# Patient Record
Sex: Male | Born: 1945 | Race: White | Hispanic: No | Smoking: Former smoker
Health system: Southern US, Community
[De-identification: ages and names within clinical notes are randomized; demographics above are authoritative.]

## PROBLEM LIST (undated history)

## (undated) DIAGNOSIS — Z87442 Personal history of urinary calculi: Secondary | ICD-10-CM

## (undated) DIAGNOSIS — R42 Dizziness and giddiness: Secondary | ICD-10-CM

## (undated) DIAGNOSIS — Z972 Presence of dental prosthetic device (complete) (partial): Secondary | ICD-10-CM

## (undated) DIAGNOSIS — E119 Type 2 diabetes mellitus without complications: Secondary | ICD-10-CM

## (undated) DIAGNOSIS — E7800 Pure hypercholesterolemia, unspecified: Secondary | ICD-10-CM

## (undated) DIAGNOSIS — M109 Gout, unspecified: Secondary | ICD-10-CM

## (undated) DIAGNOSIS — K5792 Diverticulitis of intestine, part unspecified, without perforation or abscess without bleeding: Secondary | ICD-10-CM

## (undated) DIAGNOSIS — K219 Gastro-esophageal reflux disease without esophagitis: Secondary | ICD-10-CM

## (undated) DIAGNOSIS — I10 Essential (primary) hypertension: Secondary | ICD-10-CM

## (undated) HISTORY — PX: TONSILLECTOMY: SUR1361

## (undated) HISTORY — PX: BLADDER STONE REMOVAL: SHX568

---

## 1999-06-10 HISTORY — PX: CORONARY ARTERY BYPASS GRAFT: SHX141

## 2010-12-25 DIAGNOSIS — I10 Essential (primary) hypertension: Secondary | ICD-10-CM | POA: Insufficient documentation

## 2010-12-25 DIAGNOSIS — N529 Male erectile dysfunction, unspecified: Secondary | ICD-10-CM | POA: Insufficient documentation

## 2011-08-04 DIAGNOSIS — G458 Other transient cerebral ischemic attacks and related syndromes: Secondary | ICD-10-CM | POA: Insufficient documentation

## 2011-08-04 DIAGNOSIS — I6529 Occlusion and stenosis of unspecified carotid artery: Secondary | ICD-10-CM | POA: Insufficient documentation

## 2014-10-02 DIAGNOSIS — M109 Gout, unspecified: Secondary | ICD-10-CM | POA: Insufficient documentation

## 2014-10-02 DIAGNOSIS — I25810 Atherosclerosis of coronary artery bypass graft(s) without angina pectoris: Secondary | ICD-10-CM | POA: Insufficient documentation

## 2014-10-02 DIAGNOSIS — E785 Hyperlipidemia, unspecified: Secondary | ICD-10-CM | POA: Insufficient documentation

## 2016-01-21 DIAGNOSIS — F5102 Adjustment insomnia: Secondary | ICD-10-CM | POA: Insufficient documentation

## 2016-01-21 DIAGNOSIS — D229 Melanocytic nevi, unspecified: Secondary | ICD-10-CM | POA: Insufficient documentation

## 2016-01-21 DIAGNOSIS — R058 Other specified cough: Secondary | ICD-10-CM | POA: Insufficient documentation

## 2016-01-30 DIAGNOSIS — L578 Other skin changes due to chronic exposure to nonionizing radiation: Secondary | ICD-10-CM | POA: Insufficient documentation

## 2016-06-30 DIAGNOSIS — I152 Hypertension secondary to endocrine disorders: Secondary | ICD-10-CM | POA: Insufficient documentation

## 2016-06-30 DIAGNOSIS — Z0000 Encounter for general adult medical examination without abnormal findings: Secondary | ICD-10-CM | POA: Insufficient documentation

## 2016-06-30 DIAGNOSIS — E1159 Type 2 diabetes mellitus with other circulatory complications: Secondary | ICD-10-CM | POA: Insufficient documentation

## 2016-06-30 DIAGNOSIS — E1169 Type 2 diabetes mellitus with other specified complication: Secondary | ICD-10-CM | POA: Insufficient documentation

## 2016-06-30 DIAGNOSIS — E785 Hyperlipidemia, unspecified: Secondary | ICD-10-CM | POA: Insufficient documentation

## 2016-07-03 DIAGNOSIS — Z7901 Long term (current) use of anticoagulants: Secondary | ICD-10-CM | POA: Insufficient documentation

## 2017-01-06 DIAGNOSIS — E118 Type 2 diabetes mellitus with unspecified complications: Secondary | ICD-10-CM | POA: Insufficient documentation

## 2017-12-02 ENCOUNTER — Inpatient Hospital Stay (HOSPITAL_COMMUNITY)
Admission: EM | Admit: 2017-12-02 | Discharge: 2017-12-05 | DRG: 392 | Disposition: A | Discharge status: Home / Self Care | Payer: 59 | Attending: Internal Medicine | Admitting: Internal Medicine

## 2017-12-02 ENCOUNTER — Ambulatory Visit (INDEPENDENT_AMBULATORY_CARE_PROVIDER_SITE_OTHER)
Admission: EM | Admit: 2017-12-02 | Discharge: 2017-12-02 | Disposition: A | Discharge status: Home / Self Care | Payer: 59 | Source: Home / Self Care

## 2017-12-02 ENCOUNTER — Encounter (HOSPITAL_COMMUNITY): Payer: Self-pay | Admitting: Emergency Medicine

## 2017-12-02 ENCOUNTER — Encounter (HOSPITAL_COMMUNITY): Payer: Self-pay

## 2017-12-02 ENCOUNTER — Emergency Department (HOSPITAL_COMMUNITY): Payer: 59

## 2017-12-02 ENCOUNTER — Other Ambulatory Visit: Payer: Self-pay

## 2017-12-02 DIAGNOSIS — E118 Type 2 diabetes mellitus with unspecified complications: Secondary | ICD-10-CM | POA: Diagnosis not present

## 2017-12-02 DIAGNOSIS — M109 Gout, unspecified: Secondary | ICD-10-CM | POA: Diagnosis present

## 2017-12-02 DIAGNOSIS — E1140 Type 2 diabetes mellitus with diabetic neuropathy, unspecified: Secondary | ICD-10-CM | POA: Diagnosis present

## 2017-12-02 DIAGNOSIS — I700 Atherosclerosis of aorta: Secondary | ICD-10-CM | POA: Diagnosis present

## 2017-12-02 DIAGNOSIS — R1032 Left lower quadrant pain: Secondary | ICD-10-CM

## 2017-12-02 DIAGNOSIS — K5792 Diverticulitis of intestine, part unspecified, without perforation or abscess without bleeding: Secondary | ICD-10-CM | POA: Diagnosis not present

## 2017-12-02 DIAGNOSIS — R011 Cardiac murmur, unspecified: Secondary | ICD-10-CM | POA: Diagnosis present

## 2017-12-02 DIAGNOSIS — K5720 Diverticulitis of large intestine with perforation and abscess without bleeding: Secondary | ICD-10-CM | POA: Diagnosis present

## 2017-12-02 DIAGNOSIS — I2510 Atherosclerotic heart disease of native coronary artery without angina pectoris: Secondary | ICD-10-CM | POA: Diagnosis present

## 2017-12-02 DIAGNOSIS — K219 Gastro-esophageal reflux disease without esophagitis: Secondary | ICD-10-CM | POA: Diagnosis present

## 2017-12-02 DIAGNOSIS — N183 Chronic kidney disease, stage 3 (moderate): Secondary | ICD-10-CM | POA: Diagnosis present

## 2017-12-02 DIAGNOSIS — E1122 Type 2 diabetes mellitus with diabetic chronic kidney disease: Secondary | ICD-10-CM | POA: Diagnosis present

## 2017-12-02 DIAGNOSIS — I129 Hypertensive chronic kidney disease with stage 1 through stage 4 chronic kidney disease, or unspecified chronic kidney disease: Secondary | ICD-10-CM | POA: Diagnosis present

## 2017-12-02 DIAGNOSIS — Z87442 Personal history of urinary calculi: Secondary | ICD-10-CM | POA: Diagnosis not present

## 2017-12-02 DIAGNOSIS — N179 Acute kidney failure, unspecified: Secondary | ICD-10-CM | POA: Diagnosis present

## 2017-12-02 DIAGNOSIS — E119 Type 2 diabetes mellitus without complications: Secondary | ICD-10-CM

## 2017-12-02 DIAGNOSIS — N529 Male erectile dysfunction, unspecified: Secondary | ICD-10-CM | POA: Diagnosis present

## 2017-12-02 DIAGNOSIS — Z951 Presence of aortocoronary bypass graft: Secondary | ICD-10-CM | POA: Diagnosis not present

## 2017-12-02 DIAGNOSIS — Z87891 Personal history of nicotine dependence: Secondary | ICD-10-CM

## 2017-12-02 DIAGNOSIS — N400 Enlarged prostate without lower urinary tract symptoms: Secondary | ICD-10-CM | POA: Diagnosis present

## 2017-12-02 DIAGNOSIS — Z955 Presence of coronary angioplasty implant and graft: Secondary | ICD-10-CM | POA: Diagnosis not present

## 2017-12-02 DIAGNOSIS — E785 Hyperlipidemia, unspecified: Secondary | ICD-10-CM | POA: Diagnosis present

## 2017-12-02 DIAGNOSIS — R109 Unspecified abdominal pain: Secondary | ICD-10-CM

## 2017-12-02 HISTORY — DX: Gout, unspecified: M10.9

## 2017-12-02 HISTORY — DX: Personal history of urinary calculi: Z87.442

## 2017-12-02 HISTORY — DX: Type 2 diabetes mellitus without complications: E11.9

## 2017-12-02 HISTORY — DX: Diverticulitis of intestine, part unspecified, without perforation or abscess without bleeding: K57.92

## 2017-12-02 HISTORY — DX: Pure hypercholesterolemia, unspecified: E78.00

## 2017-12-02 HISTORY — DX: Gastro-esophageal reflux disease without esophagitis: K21.9

## 2017-12-02 HISTORY — DX: Essential (primary) hypertension: I10

## 2017-12-02 LAB — CBC WITH DIFFERENTIAL/PLATELET
ABS IMMATURE GRANULOCYTES: 0 10*3/uL (ref 0.0–0.1)
BASOS PCT: 0 %
Basophils Absolute: 0 10*3/uL (ref 0.0–0.1)
Eosinophils Absolute: 0.1 10*3/uL (ref 0.0–0.7)
Eosinophils Relative: 1 %
HEMATOCRIT: 48.1 % (ref 39.0–52.0)
HEMOGLOBIN: 15.9 g/dL (ref 13.0–17.0)
Immature Granulocytes: 0 %
LYMPHS PCT: 17 %
Lymphs Abs: 1.8 10*3/uL (ref 0.7–4.0)
MCH: 30.1 pg (ref 26.0–34.0)
MCHC: 33.1 g/dL (ref 30.0–36.0)
MCV: 90.9 fL (ref 78.0–100.0)
MONO ABS: 1.2 10*3/uL — AB (ref 0.1–1.0)
MONOS PCT: 11 %
NEUTROS ABS: 7.3 10*3/uL (ref 1.7–7.7)
Neutrophils Relative %: 71 %
Platelets: 166 10*3/uL (ref 150–400)
RBC: 5.29 MIL/uL (ref 4.22–5.81)
RDW: 14.6 % (ref 11.5–15.5)
WBC: 10.5 10*3/uL (ref 4.0–10.5)

## 2017-12-02 LAB — POCT URINALYSIS DIP (DEVICE)
Bilirubin Urine: NEGATIVE
GLUCOSE, UA: 100 mg/dL — AB
Hgb urine dipstick: NEGATIVE
KETONES UR: NEGATIVE mg/dL
LEUKOCYTES UA: NEGATIVE
Nitrite: NEGATIVE
Protein, ur: NEGATIVE mg/dL
UROBILINOGEN UA: 1 mg/dL (ref 0.0–1.0)
pH: 5.5 (ref 5.0–8.0)

## 2017-12-02 LAB — COMPREHENSIVE METABOLIC PANEL
ALT: 22 U/L (ref 0–44)
AST: 24 U/L (ref 15–41)
Albumin: 3.8 g/dL (ref 3.5–5.0)
Alkaline Phosphatase: 87 U/L (ref 38–126)
Anion gap: 9 (ref 5–15)
BILIRUBIN TOTAL: 1.7 mg/dL — AB (ref 0.3–1.2)
BUN: 18 mg/dL (ref 8–23)
CO2: 27 mmol/L (ref 22–32)
Calcium: 8.9 mg/dL (ref 8.9–10.3)
Chloride: 102 mmol/L (ref 98–111)
Creatinine, Ser: 1.45 mg/dL — ABNORMAL HIGH (ref 0.61–1.24)
GFR calc Af Amer: 53 mL/min — ABNORMAL LOW (ref >60)
GFR calc non Af Amer: 46 mL/min — ABNORMAL LOW (ref >60)
Glucose, Bld: 105 mg/dL — ABNORMAL HIGH (ref 70–99)
POTASSIUM: 4.3 mmol/L (ref 3.5–5.1)
Sodium: 138 mmol/L (ref 135–145)
TOTAL PROTEIN: 7.6 g/dL (ref 6.5–8.1)

## 2017-12-02 LAB — LIPASE, BLOOD: Lipase: 31 U/L (ref 11–51)

## 2017-12-02 LAB — I-STAT CHEM 8, ED
BUN: 21 mg/dL (ref 8–23)
CALCIUM ION: 1.1 mmol/L — AB (ref 1.15–1.40)
CHLORIDE: 99 mmol/L (ref 98–111)
CREATININE: 1.3 mg/dL — AB (ref 0.61–1.24)
GLUCOSE: 99 mg/dL (ref 70–99)
HCT: 49 % (ref 39.0–52.0)
Hemoglobin: 16.7 g/dL (ref 13.0–17.0)
Potassium: 4.2 mmol/L (ref 3.5–5.1)
Sodium: 137 mmol/L (ref 135–145)
TCO2: 25 mmol/L (ref 22–32)

## 2017-12-02 LAB — GLUCOSE, CAPILLARY: GLUCOSE-CAPILLARY: 108 mg/dL — AB (ref 70–99)

## 2017-12-02 MED ORDER — ACETAMINOPHEN 325 MG PO TABS
650.0000 mg | ORAL_TABLET | Freq: Four times a day (QID) | ORAL | Status: DC | PRN
  Administered 2017-12-03: 650 mg via ORAL
  Filled 2017-12-02: qty 2

## 2017-12-02 MED ORDER — ALLOPURINOL 300 MG PO TABS
300.0000 mg | ORAL_TABLET | Freq: Every day | ORAL | Status: DC
  Administered 2017-12-03 – 2017-12-05 (×3): 300 mg via ORAL
  Filled 2017-12-02 (×3): qty 1

## 2017-12-02 MED ORDER — ATORVASTATIN CALCIUM 20 MG PO TABS
20.0000 mg | ORAL_TABLET | Freq: Every day | ORAL | Status: DC
  Administered 2017-12-03 – 2017-12-05 (×3): 20 mg via ORAL
  Filled 2017-12-02 (×3): qty 1

## 2017-12-02 MED ORDER — CLOPIDOGREL BISULFATE 75 MG PO TABS
75.0000 mg | ORAL_TABLET | Freq: Every day | ORAL | Status: DC
  Administered 2017-12-03 – 2017-12-05 (×3): 75 mg via ORAL
  Filled 2017-12-02 (×3): qty 1

## 2017-12-02 MED ORDER — ASPIRIN 325 MG PO TABS
325.0000 mg | ORAL_TABLET | Freq: Every day | ORAL | Status: DC
  Administered 2017-12-03 – 2017-12-05 (×3): 325 mg via ORAL
  Filled 2017-12-02 (×3): qty 1

## 2017-12-02 MED ORDER — GABAPENTIN 300 MG PO CAPS
300.0000 mg | ORAL_CAPSULE | Freq: Every day | ORAL | Status: DC
  Administered 2017-12-03 – 2017-12-05 (×3): 300 mg via ORAL
  Filled 2017-12-02 (×3): qty 1

## 2017-12-02 MED ORDER — CIPROFLOXACIN IN D5W 400 MG/200ML IV SOLN
400.0000 mg | Freq: Two times a day (BID) | INTRAVENOUS | Status: DC
  Administered 2017-12-03 – 2017-12-05 (×5): 400 mg via INTRAVENOUS
  Filled 2017-12-02 (×5): qty 200

## 2017-12-02 MED ORDER — TRAZODONE HCL 50 MG PO TABS: 50.0000 mg | ORAL_TABLET | Freq: Every evening | ORAL | Status: DC | PRN

## 2017-12-02 MED ORDER — ONDANSETRON HCL 4 MG/2ML IJ SOLN: 4.0000 mg | Freq: Four times a day (QID) | INTRAMUSCULAR | Status: DC | PRN

## 2017-12-02 MED ORDER — ENOXAPARIN SODIUM 40 MG/0.4ML ~~LOC~~ SOLN
40.0000 mg | SUBCUTANEOUS | Status: DC
  Administered 2017-12-02 – 2017-12-04 (×3): 40 mg via SUBCUTANEOUS
  Filled 2017-12-02 (×3): qty 0.4

## 2017-12-02 MED ORDER — IOHEXOL 300 MG/ML  SOLN
100.0000 mL | Freq: Once | INTRAMUSCULAR | Status: AC | PRN
  Administered 2017-12-02: 100 mL via INTRAVENOUS

## 2017-12-02 MED ORDER — INSULIN ASPART 100 UNIT/ML ~~LOC~~ SOLN
0.0000 [IU] | Freq: Three times a day (TID) | SUBCUTANEOUS | Status: DC
  Administered 2017-12-04: 1 [IU] via SUBCUTANEOUS

## 2017-12-02 MED ORDER — HYDROCODONE-ACETAMINOPHEN 5-325 MG PO TABS
1.0000 | ORAL_TABLET | Freq: Once | ORAL | Status: AC
  Administered 2017-12-02: 1 via ORAL
  Filled 2017-12-02: qty 1

## 2017-12-02 MED ORDER — METRONIDAZOLE IN NACL 5-0.79 MG/ML-% IV SOLN
500.0000 mg | Freq: Three times a day (TID) | INTRAVENOUS | Status: DC
  Administered 2017-12-03 – 2017-12-05 (×7): 500 mg via INTRAVENOUS
  Filled 2017-12-02 (×8): qty 100

## 2017-12-02 MED ORDER — METOPROLOL SUCCINATE ER 50 MG PO TB24
50.0000 mg | ORAL_TABLET | Freq: Every day | ORAL | Status: DC
  Administered 2017-12-03 – 2017-12-05 (×3): 50 mg via ORAL
  Filled 2017-12-02 (×3): qty 1

## 2017-12-02 MED ORDER — ONDANSETRON HCL 4 MG PO TABS: 4.0000 mg | ORAL_TABLET | Freq: Four times a day (QID) | ORAL | Status: DC | PRN

## 2017-12-02 MED ORDER — PANTOPRAZOLE SODIUM 20 MG PO TBEC
20.0000 mg | DELAYED_RELEASE_TABLET | Freq: Every day | ORAL | Status: DC
  Administered 2017-12-03 – 2017-12-05 (×3): 20 mg via ORAL
  Filled 2017-12-02 (×3): qty 1

## 2017-12-02 MED ORDER — ONDANSETRON 4 MG PO TBDP
4.0000 mg | ORAL_TABLET | Freq: Once | ORAL | Status: AC
  Administered 2017-12-02: 4 mg via ORAL
  Filled 2017-12-02: qty 1

## 2017-12-02 MED ORDER — CIPROFLOXACIN IN D5W 400 MG/200ML IV SOLN
400.0000 mg | Freq: Once | INTRAVENOUS | Status: AC
  Administered 2017-12-02: 400 mg via INTRAVENOUS
  Filled 2017-12-02: qty 200

## 2017-12-02 MED ORDER — HYDROCODONE-ACETAMINOPHEN 5-325 MG PO TABS
1.0000 | ORAL_TABLET | ORAL | Status: DC | PRN
  Administered 2017-12-02: 1 via ORAL
  Filled 2017-12-02: qty 1

## 2017-12-02 MED ORDER — METRONIDAZOLE IN NACL 5-0.79 MG/ML-% IV SOLN
500.0000 mg | Freq: Once | INTRAVENOUS | Status: AC
  Administered 2017-12-02: 500 mg via INTRAVENOUS
  Filled 2017-12-02: qty 100

## 2017-12-02 MED ORDER — ACETAMINOPHEN 650 MG RE SUPP: 650.0000 mg | Freq: Four times a day (QID) | RECTAL | Status: DC | PRN

## 2017-12-02 MED ORDER — SODIUM CHLORIDE 0.9 % IV SOLN
INTRAVENOUS | Status: DC
  Administered 2017-12-02 – 2017-12-03 (×3): via INTRAVENOUS

## 2017-12-02 NOTE — ED Triage Notes (Signed)
Patient to ED from [facility] for further evaluation of LLQ pain x 2 days, denies N/V/D or fevers/chills. Some positions make pain worse.

## 2017-12-02 NOTE — H&P (Signed)
History and Physical    Brendan Callahan KGY:[id number] DOB: 1946-01-10 DOA: 12/02/2017  PCP: Vira Blanco, MD   Patient coming from: Home   Chief Complaint: Abdominal Pain  HPI: Brendan Callahan is a 72 y.o. male with medical history significant for CAD s/p CABG 2001, HTN, DM, Gout, who presented to the ED with complaints of left-sided lower abdominal pain of 2 days duration.  Patient denies fevers but endorses chills 3 days ago. Patient denies nausea vomiting or loose stools, but reports hardly any p.o. intake over the past 2 days.  No dysuria.  ED Course: Heart rate 56-62, temperature 02.6, systolic blood pressure 378- 149.  WBC 10.5.  Creatinine elevated 1.45.  Lipase 31.  UA clean.  CT abdomen and pelvis W contrast-acute sigmoid diverticulitis.  Few small locules of gas within this region suspicious for possible early microperforation.  No evidence of abscess.  Extensive aortic atherosclerosis.  Enlarged prostate.  Patient was started on IV metronidazole and IV ciprofloxacin.  Hospitalist was called to admit for sigmoid diverticulitis.  Review of Systems: As per HPI otherwise 10 point review of systems negative.   Past Medical History:  Diagnosis Date  . Diabetes mellitus without complication ([place])   . GERD (gastroesophageal reflux disease)   . Hypertension     History reviewed. No pertinent surgical history.   reports that he has never smoked. He has never used smokeless tobacco. He reports that he drinks alcohol. He reports that he does not use drugs.  No Known Allergies  Family History  Problem Relation Age of Onset  . Hernia Mother   . Healthy Father    Prior to Admission medications   Not on File    Physical Exam: Vitals:   12/02/17 1745 12/02/17 1830 12/02/17 1900 12/02/17 1930  BP: 128/68 (!) 149/80 (!) 146/83 137/86  Pulse: 62 (!) 58 (!) 55 (!) 55  Resp: 14 19 15 18   Temp:      TempSrc:      SpO2: 91% 94% 93% 95%    Constitutional: NAD, calm,  comfortable Vitals:   12/02/17 1745 12/02/17 1830 12/02/17 1900 12/02/17 1930  BP: 128/68 (!) 149/80 (!) 146/83 137/86  Pulse: 62 (!) 58 (!) 55 (!) 55  Resp: 14 19 15 18   Temp:      TempSrc:      SpO2: 91% 94% 93% 95%   Eyes: PERRL, lids and conjunctivae normal ENMT: Mucous membranes are moist. Posterior pharynx clear of any exudate or lesions. Neck: normal, supple, no masses, no thyromegaly Respiratory: clear to auscultation bilaterally, no wheezing, no crackles. Normal respiratory effort. No accessory muscle use.  Cardiovascular: Regular rate and rhythm, 3/6 systolic mumur- patient reports he is not aware, no rubs / gallops. No extremity edema. 2+ pedal pulses.  Abdomen: Mild LLQ tenderness, no masses palpated. No hepatosplenomegaly. Bowel sounds reduced.  musculoskeletal: no clubbing / cyanosis. No joint deformity upper and lower extremities. Good ROM, no contractures. Normal muscle tone.  Skin: no rashes, lesions, ulcers. No induration Neurologic: CN 2-12 grossly intact. Strength 5/5 in all 4.  Psychiatric: Normal judgment and insight. Alert and oriented x 3. Normal mood.   Labs on Admission: I have personally reviewed following labs and imaging studies  CBC: Recent Labs  Lab 12/02/17 1617 12/02/17 1624  WBC 10.5  --   NEUTROABS 7.3  --   HGB 15.9 16.7  HCT 48.1 49.0  MCV 90.9  --   PLT 166  --    Basic  Metabolic Panel: Recent Labs  Lab 12/02/17 1617 12/02/17 1624  NA 138 137  K 4.3 4.2  CL 102 99  CO2 27  --   GLUCOSE 105* 99  BUN 18 21  CREATININE 1.45* 1.30*  CALCIUM 8.9  --    GFR: CrCl cannot be calculated (Unknown ideal weight.). Liver Function Tests: Recent Labs  Lab 12/02/17 1617  AST 24  ALT 22  ALKPHOS 87  BILITOT 1.7*  PROT 7.6  ALBUMIN 3.8   Recent Labs  Lab 12/02/17 1617  LIPASE 31   Urine analysis:    Component Value Date/Time   LABSPEC >=1.030 12/02/2017 1503   PHURINE 5.5 12/02/2017 1503   GLUCOSEU 100 (A) 12/02/2017 1503    HGBUR NEGATIVE 12/02/2017 1503   BILIRUBINUR NEGATIVE 12/02/2017 1503   KETONESUR NEGATIVE 12/02/2017 1503   PROTEINUR NEGATIVE 12/02/2017 1503   UROBILINOGEN 1.0 12/02/2017 1503   NITRITE NEGATIVE 12/02/2017 1503   LEUKOCYTESUR NEGATIVE 12/02/2017 1503    Radiological Exams on Admission: Ct Abdomen Pelvis W Contrast  Result Date: 12/02/2017 CLINICAL DATA:  Initial evaluation for acute abdominal pain, suspect diverticulitis. EXAM: CT ABDOMEN AND PELVIS WITH CONTRAST TECHNIQUE: Multidetector CT imaging of the abdomen and pelvis was performed using the standard protocol following bolus administration of intravenous contrast. CONTRAST:  171mL OMNIPAQUE IOHEXOL 300 MG/ML  SOLN COMPARISON:  None available. FINDINGS: Lower chest: Visualized lung bases are clear. Hepatobiliary: Liver demonstrates a normal contrast enhanced appearance. Gallbladder within normal limits. No biliary dilatation. Pancreas: Scattered calcifications within the pancreatic body, likely related to previous/chronic pancreatitis. Pancreas otherwise unremarkable without acute inflammatory changes. Spleen: Spleen within normal limits. Adrenals/Urinary Tract: Adrenal glands are normal. Kidneys equal in size with symmetric enhancement. No nephrolithiasis, hydronephrosis, or focal enhancing renal mass. Few subcentimeter hypodensities within the right kidney too small the characterize. No hydroureter. Partially distended bladder within normal limits. Stomach/Bowel: Stomach within normal limits. No evidence for bowel obstruction. Normal appendix. Prominent inflammatory changes seen about multiple colonic diverticula within the left lower quadrant, consistent with acute diverticulitis. No evidence for abscess. Few tiny locules of air better not definitely position within the colonic lumen suspicious for possible micro perforation (series 7, image 152). No other acute inflammatory changes seen about the bowels. Vascular/Lymphatic: Extensive aorto  bi-iliac atherosclerotic disease. Mesenteric vessels patent proximally. No adenopathy. Reproductive: Prostate enlarged measuring up to 5.9 cm in transverse diameter. Other: No free air or fluid. Small fat containing paraumbilical and bilateral inguinal hernias noted. Musculoskeletal: Chronic fatty atrophy noted within the posterior paraspinous musculature. No acute osseous abnormality. No worrisome lytic or blastic osseous lesions. Multilevel degenerative spondylolysis, most notable at L4-5 and L5-S1. IMPRESSION: 1. Findings consistent with acute sigmoid diverticulitis. Few small locules of gas within this region suspicious for possible early micro perforation. No evidence for abscess. 2. Extensive aortic atherosclerosis. 3. Enlarged prostate. Critical Value/emergent results were called by telephone at the time of interpretation on 12/02/2017 at 5:47 pm to [facility] , who verbally acknowledged these results. Electronically Signed   By: Jeannine Boga M.D.   On: 12/02/2017 17:48    ENI:DPOE  Assessment/Plan Principal Problem:   Acute diverticulitis Active Problems:   CAD (coronary artery disease)   DM (diabetes mellitus) (HCC)   Gout   GERD (gastroesophageal reflux disease)  Acute Sigmoid diverticulitis- Abdominal Pain. WBC- 10.5.  Patient rules-out for sepsis. Abdominal CT W contrast-acute sigmoid diverticulitis, possible early microperforation.  No evidence for abscess. -Continue IV metronidazole ciprofloxacin started in ED -Bowel rest with clear  liquid diet -Hydrocodone acetaminophen as needed -New home Protonix  AKI- Cr- 1.45, per care everywhere last creatinine 06/2016- 0.9.  Contrast exposure today for CT abd. - N/s 100cc/hr x 1 day -BMP a.m.  CAD s/p CABG- 2001-appears stable. 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 Patient reports he is  not aware.  Does not appear symptomatic from it.  -Can follow-up outpatient   DVT prophylaxis: Lovenox Code Status: full Family Communication: None at bedside Disposition Plan: Per rounding team Consults called: None Admission status: Inpatient, MedSurg   Bethena Roys MD [facility] Pager [phone] From 6PM-2AM.  Otherwise please contact night-coverage [email or web address] Password [facility]  12/02/2017, 8:12 PM

## 2017-12-02 NOTE — ED Provider Notes (Signed)
Emergency Department Provider Note   I have reviewed the triage vital signs and the nursing notes.   HISTORY  Chief Complaint Abdominal Pain   HPI Brendan Callahan is a 72 y.o. male history of diabetes, hypertension who presents to the emergency department today with left lower quadrant abdominal pain.  Patient states that has been going on for couple days and significantly worse.  No nausea, vomiting, constipation, fevers or chills.  States never had as before.  No trauma to the area.  No pulled muscles or anything of that sort.  No urinary symptoms.  No back pain.  History of kidney stones but not like this. No other associated or modifying symptoms.    Past Medical History:  Diagnosis Date  . Acute diverticulitis 12/02/2017  . GERD (gastroesophageal reflux disease)   . Gout    "on daily RX" (12/02/2017)  . High cholesterol   . History of kidney stones   . Hypertension   . Type II diabetes mellitus [facility])     Patient Active Problem List   Diagnosis Date Noted  . Acute diverticulitis 12/02/2017  . CAD (coronary artery disease) 12/02/2017  . DM (diabetes mellitus) ([place]) 12/02/2017  . Gout 12/02/2017  . GERD (gastroesophageal reflux disease) 12/02/2017    Past Surgical History:  Procedure Laterality Date  . BLADDER STONE REMOVAL  ~ 2004  . CORONARY ARTERY BYPASS GRAFT  2001   CABG X3"  . TONSILLECTOMY        Allergies Patient has no known allergies.  Family History  Problem Relation Age of Onset  . Hernia Mother   . Healthy Father     Social History Social History   Tobacco Use  . Smoking status: Former Smoker    Packs/day: 1.00    Years: 20.00    Pack years: 20.00    Types: Cigarettes  . Smokeless tobacco: Never Used  . Tobacco comment: 12/02/2017 "nothing since 1980s"  Substance Use Topics  . Alcohol use: Yes    Frequency: Never    Comment: 12/02/2017 "couple beers/month"  . Drug use: Never    Review of Systems  All other systems negative  except as documented in the HPI. All pertinent positives and negatives as reviewed in the HPI. ____________________________________________   PHYSICAL EXAM:  VITAL SIGNS: ED Triage Vitals  Enc Vitals Group     BP 12/02/17 1533 131/76     Pulse Rate 12/02/17 1533 62     Resp 12/02/17 1533 18     Temp 12/02/17 1533 97.9 F (36.6 C)     Temp Source 12/02/17 1533 Oral     SpO2 12/02/17 1533 96 %    Constitutional: Alert and oriented. Well appearing and in no acute distress. Eyes: Conjunctivae are normal. PERRL. EOMI. Head: Atraumatic. Nose: No congestion/rhinnorhea. Mouth/Throat: Mucous membranes are moist.  Oropharynx non-erythematous. Neck: No stridor.  No meningeal signs.   Cardiovascular: Normal rate, regular rhythm. Good peripheral circulation. Grossly normal heart sounds.   Respiratory: Normal respiratory effort.  No retractions. Lungs CTAB. Gastrointestinal: Soft and ttp with localized guarding in LLQ. No distention.  Musculoskeletal: No lower extremity tenderness nor edema. No gross deformities of extremities. Neurologic:  Normal speech and language. No gross focal neurologic deficits are appreciated.  Skin:  Skin is warm, dry and intact. No rash noted.   ____________________________________________   LABS (all labs ordered are listed, but only abnormal results are displayed)  Labs Reviewed  COMPREHENSIVE METABOLIC PANEL - Abnormal; Notable for the following  components:      Result Value   Glucose, Bld 105 (*)    Creatinine, Ser 1.45 (*)    Total Bilirubin 1.7 (*)    GFR calc non Af Amer 46 (*)    GFR calc Af Amer 53 (*)    All other components within normal limits  CBC WITH DIFFERENTIAL/PLATELET - Abnormal; Notable for the following components:   Monocytes Absolute 1.2 (*)    All other components within normal limits  GLUCOSE, CAPILLARY - Abnormal; Notable for the following components:   Glucose-Capillary 108 (*)    All other components within normal limits    I-STAT CHEM 8, ED - Abnormal; Notable for the following components:   Creatinine, Ser 1.30 (*)    Calcium, Ion 1.10 (*)    All other components within normal limits  LIPASE, BLOOD  BASIC METABOLIC PANEL   ____________________________________________   RADIOLOGY  Ct Abdomen Pelvis W Contrast  Result Date: 12/02/2017 CLINICAL DATA:  Initial evaluation for acute abdominal pain, suspect diverticulitis. EXAM: CT ABDOMEN AND PELVIS WITH CONTRAST TECHNIQUE: Multidetector CT imaging of the abdomen and pelvis was performed using the standard protocol following bolus administration of intravenous contrast. CONTRAST:  188mL OMNIPAQUE IOHEXOL 300 MG/ML  SOLN COMPARISON:  None available. FINDINGS: Lower chest: Visualized lung bases are clear. Hepatobiliary: Liver demonstrates a normal contrast enhanced appearance. Gallbladder within normal limits. No biliary dilatation. Pancreas: Scattered calcifications within the pancreatic body, likely related to previous/chronic pancreatitis. Pancreas otherwise unremarkable without acute inflammatory changes. Spleen: Spleen within normal limits. Adrenals/Urinary Tract: Adrenal glands are normal. Kidneys equal in size with symmetric enhancement. No nephrolithiasis, hydronephrosis, or focal enhancing renal mass. Few subcentimeter hypodensities within the right kidney too small the characterize. No hydroureter. Partially distended bladder within normal limits. Stomach/Bowel: Stomach within normal limits. No evidence for bowel obstruction. Normal appendix. Prominent inflammatory changes seen about multiple colonic diverticula within the left lower quadrant, consistent with acute diverticulitis. No evidence for abscess. Few tiny locules of air better not definitely position within the colonic lumen suspicious for possible micro perforation (series 7, image 152). No other acute inflammatory changes seen about the bowels. Vascular/Lymphatic: Extensive aorto bi-iliac atherosclerotic  disease. Mesenteric vessels patent proximally. No adenopathy. Reproductive: Prostate enlarged measuring up to 5.9 cm in transverse diameter. Other: No free air or fluid. Small fat containing paraumbilical and bilateral inguinal hernias noted. Musculoskeletal: Chronic fatty atrophy noted within the posterior paraspinous musculature. No acute osseous abnormality. No worrisome lytic or blastic osseous lesions. Multilevel degenerative spondylolysis, most notable at L4-5 and L5-S1. IMPRESSION: 1. Findings consistent with acute sigmoid diverticulitis. Few small locules of gas within this region suspicious for possible early micro perforation. No evidence for abscess. 2. Extensive aortic atherosclerosis. 3. Enlarged prostate. Critical Value/emergent results were called by telephone at the time of interpretation on 12/02/2017 at 5:47 pm to [facility] , who verbally acknowledged these results. Electronically Signed   By: Jeannine Boga M.D.   On: 12/02/2017 17:48    ____________________________________________   PROCEDURES  Procedure(s) performed:   Procedures   ____________________________________________   INITIAL IMPRESSION / ASSESSMENT AND PLAN / ED COURSE  CT scan with diverticulitis and microperforation without abscess.  No frank peritonitis.  Antibiotic started.  Will admit to the hospital.     Pertinent labs & imaging results that were available during my care of the patient were reviewed by me and considered in my medical decision making (see chart for details).  ____________________________________________  FINAL CLINICAL IMPRESSION(S) / ED  DIAGNOSES  Final diagnoses:  Diverticulitis of large intestine with perforation, unspecified bleeding status     MEDICATIONS GIVEN DURING THIS VISIT:  Medications  enoxaparin (LOVENOX) injection 40 mg (40 mg Subcutaneous Given 12/02/17 2037)  acetaminophen (TYLENOL) tablet 650 mg (has no administration in time range)    Or    acetaminophen (TYLENOL) suppository 650 mg (has no administration in time range)  HYDROcodone-acetaminophen (NORCO/VICODIN) 5-325 MG per tablet 1 tablet (1 tablet Oral Given 12/02/17 2245)  ondansetron (ZOFRAN) tablet 4 mg (has no administration in time range)    Or  ondansetron (ZOFRAN) injection 4 mg (has no administration in time range)  traZODone (DESYREL) tablet 50 mg (has no administration in time range)  0.9 %  sodium chloride infusion ( Intravenous New Bag/Given 12/02/17 2037)  ciprofloxacin (CIPRO) IVPB 400 mg (has no administration in time range)  metroNIDAZOLE (FLAGYL) IVPB 500 mg (has no administration in time range)  allopurinol (ZYLOPRIM) tablet 300 mg (has no administration in time range)  aspirin tablet 325 mg (has no administration in time range)  atorvastatin (LIPITOR) tablet 20 mg (has no administration in time range)  clopidogrel (PLAVIX) tablet 75 mg (has no administration in time range)  gabapentin (NEURONTIN) capsule 300 mg (has no administration in time range)  metoprolol succinate (TOPROL-XL) 24 hr tablet 50 mg (has no administration in time range)  pantoprazole (PROTONIX) EC tablet 20 mg (has no administration in time range)  insulin aspart (novoLOG) injection 0-9 Units (has no administration in time range)  HYDROcodone-acetaminophen (NORCO/VICODIN) 5-325 MG per tablet 1 tablet (1 tablet Oral Given 12/02/17 1609)  ondansetron (ZOFRAN-ODT) disintegrating tablet 4 mg (4 mg Oral Given 12/02/17 1609)  iohexol (OMNIPAQUE) 300 MG/ML solution 100 mL (100 mLs Intravenous Contrast Given 12/02/17 1654)  ciprofloxacin (CIPRO) IVPB 400 mg (400 mg Intravenous New Bag/Given 12/02/17 1854)  metroNIDAZOLE (FLAGYL) IVPB 500 mg (500 mg Intravenous New Bag/Given 12/02/17 1852)     NEW OUTPATIENT MEDICATIONS STARTED DURING THIS VISIT:  Current Discharge Medication List      Note:  This note was prepared with assistance of Dragon voice recognition software. Occasional wrong-word or  sound-a-like substitutions may have occurred due to the inherent limitations of voice recognition software.   Lunabella Badgett, Corene Cornea, MD 12/03/17 [phone]

## 2017-12-02 NOTE — ED Triage Notes (Signed)
Pt presents with left sided flank pain x 2 days.

## 2017-12-02 NOTE — Discharge Instructions (Signed)
Please report to the ER immediately as you need to be evaluated for diverticulitis given your severe left lower abdominal pain.

## 2017-12-02 NOTE — ED Provider Notes (Signed)
  MRN: [id number] DOB: 16-Apr-1946  Subjective:   Brendan Callahan is a 72 y.o. male presenting for 2 day history of acute left groin pain. Had associated chills, decreased appetite. Has a history of renal stones, last episode was ~10 years ago. Has also been urinating frequently. Denies fever, n/v, bloody stools, diarrhea, constipation, dysuria, hematuria. Denies ever having a colonoscopy, history of diverticulitis, history of hernia.  Patient is currently taking metformin, sildenafil, gabapentin, Plavix, metoprolol, Lipitor, allopurinol.     No Known Allergies  Past Medical History:  Diagnosis Date  . Diabetes mellitus without complication ([place])   . GERD (gastroesophageal reflux disease)   . Hypertension     Has a history of stent placement, triple bypass heart surgery.   Objective:   Vitals: BP (!) 141/71   Pulse 63   Temp 98.4 F (36.9 C)   Resp 18   SpO2 100%   Physical Exam  Constitutional: He is oriented to person, place, and time. He appears well-developed and well-nourished.  HENT:  Mouth/Throat: Oropharynx is clear and moist.  Eyes: No scleral icterus.  Cardiovascular: Normal rate, regular rhythm and intact distal pulses. Exam reveals no gallop and no friction rub.  No murmur heard. Pulmonary/Chest: No respiratory distress. He has no wheezes. He has no rales.  Abdominal: Soft. Bowel sounds are normal. He exhibits no distension and no mass. There is tenderness (exiquisite over LLQ). There is guarding. There is no rebound.  Neurological: He is alert and oriented to person, place, and time.  Skin: Skin is warm and dry.  Psychiatric: He has a normal mood and affect.   Results for orders placed or performed during the hospital encounter of 12/02/17 (from the past 24 hour(s))  POCT urinalysis dip (device)     Status: Abnormal   Collection Time: 12/02/17  3:03 PM  Result Value Ref Range   Glucose, UA 100 (A) NEGATIVE mg/dL   Bilirubin Urine NEGATIVE NEGATIVE   Ketones, ur  NEGATIVE NEGATIVE mg/dL   Specific Gravity, Urine >=1.030 1.005 - 1.030   Hgb urine dipstick NEGATIVE NEGATIVE   pH 5.5 5.0 - 8.0   Protein, ur NEGATIVE NEGATIVE mg/dL   Urobilinogen, UA 1.0 0.0 - 1.0 mg/dL   Nitrite NEGATIVE NEGATIVE   Leukocytes, UA NEGATIVE NEGATIVE   Assessment and Plan :   Abdominal pain, left lower quadrant  Acute abdominal pain  Patient symptoms and physical exam findings concerning for acute diverticulitis.  Patient was directed to the ER for consideration of his CT abdomen and pelvis to rule out acute abdomen versus diverticulitis.  Patient verbalized understanding and agreed to report to the ER immediately.   Jaynee Eagles, PA-C 12/02/17 1511

## 2017-12-02 NOTE — ED Provider Notes (Signed)
Patient placed in Quick Look pathway, seen and evaluated   Chief Complaint: LLQ abd  HPI:    Patient sent from UC,, L:LQ AP x 2 days. Currently 3/10, worse with movement and breathing + chills, - n/v/d, - PSHx and- fevers ROS: LLQ abd pain (one)  Physical Exam:   Gen: No distress  Neuro: Awake and Alert  Skin: Warm    Focused Exam: TTP LLQ abd   Initiation of care has begun. The patient has been counseled on the process, plan, and necessity for staying for the completion/evaluation, and the remainder of the medical screening examination    Margarita Mail, Hershal Coria 12/02/17 1602    Mesner, Corene Cornea, MD 12/02/17 [phone]

## 2017-12-03 DIAGNOSIS — K5792 Diverticulitis of intestine, part unspecified, without perforation or abscess without bleeding: Secondary | ICD-10-CM

## 2017-12-03 DIAGNOSIS — K5720 Diverticulitis of large intestine with perforation and abscess without bleeding: Principal | ICD-10-CM

## 2017-12-03 DIAGNOSIS — K219 Gastro-esophageal reflux disease without esophagitis: Secondary | ICD-10-CM

## 2017-12-03 DIAGNOSIS — N179 Acute kidney failure, unspecified: Secondary | ICD-10-CM

## 2017-12-03 LAB — GLUCOSE, CAPILLARY
GLUCOSE-CAPILLARY: 95 mg/dL (ref 70–99)
GLUCOSE-CAPILLARY: 79 mg/dL (ref 70–99)
GLUCOSE-CAPILLARY: 97 mg/dL (ref 70–99)
Glucose-Capillary: 140 mg/dL — ABNORMAL HIGH (ref 70–99)

## 2017-12-03 LAB — BASIC METABOLIC PANEL
ANION GAP: 9 (ref 5–15)
BUN: 14 mg/dL (ref 8–23)
CHLORIDE: 102 mmol/L (ref 98–111)
CO2: 26 mmol/L (ref 22–32)
Calcium: 8.2 mg/dL — ABNORMAL LOW (ref 8.9–10.3)
Creatinine, Ser: 1.22 mg/dL (ref 0.61–1.24)
GFR calc Af Amer: >60 mL/min (ref >60)
GFR calc non Af Amer: 57 mL/min — ABNORMAL LOW (ref >60)
GLUCOSE: 142 mg/dL — AB (ref 70–99)
POTASSIUM: 3.8 mmol/L (ref 3.5–5.1)
Sodium: 137 mmol/L (ref 135–145)

## 2017-12-03 MED ORDER — SODIUM CHLORIDE 0.9 % IV SOLN
INTRAVENOUS | Status: DC
  Administered 2017-12-04: 05:00:00 via INTRAVENOUS

## 2017-12-03 NOTE — Progress Notes (Signed)
PROGRESS NOTE   Brendan Callahan  KDX:[id number]    DOB: [birth year]    DOA: 12/02/2017  PCP: Vira Blanco, MD   I have briefly reviewed patients previous medical records in [facility].  Brief Narrative:  72 year old male with PMH of CAD status post CABG, HTN, DM, gout, GERD presented to the ED with 2 days history of LLQ abdominal pain and CT abdomen confirmed acute sigmoid diverticulitis with few small locules of gas within this region suspicious for possible early microperforation.  Admitted for management.  General surgery consulted.   Assessment & Plan:   Principal Problem:   Acute diverticulitis Active Problems:   CAD (coronary artery disease)   DM (diabetes mellitus) (HCC)   Gout   GERD (gastroesophageal reflux disease)   Acute sigmoid diverticulitis with possible microperforation but no abscess: Treating supportively with bowel rest/continue clear liquids, IV metronidazole and ciprofloxacin, IV fluids.  Given suspected microperforation on CT, general surgery was consulted, agree with management, no planned surgical intervention at this time, recommend holding on advancing diet further until pain-free.  Patient will need screening colonoscopy 6 to 8 weeks after complete resolution of symptoms.  He has never had colonoscopy before.  Acute kidney injury: Presented with creatinine of 1.45.  Previous creatinine 06/2016: 0.9.  Hydrating with IV fluids.  Improved to 1.22.  Follow BMP in a.m.  CAD status post CABG: Asymptomatic and stable.  Continue aspirin, Plavix, Toprol-XL and atorvastatin.  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  Continue SSI.  Good inpatient control.  Continue gabapentin  Essential hypertension: Controlled.  Continue Toprol-XL.  Heart murmur: Patient unaware.  Outpatient follow-up with PCP.  GERD: Continue PPI.  Gout: No acute flare currently.   DVT prophylaxis: Lovenox Code Status: Full Family Communication: None at  bedside Disposition: DC home pending clinical improvement, may take another couple days.   Consultants:  General surgery  Procedures:  None  Antimicrobials:  IV Cipro and Flagyl 6/26 >   Subjective: Left lower quadrant abdominal pain has improved, down to 4/10.  Able to ambulate without discomfort.  No nausea or vomiting.  Tolerating clears.  No fever or chills.  Last BM 2 days PTA.  Denies chest pain or dyspnea.  ROS: As above.  Objective:  Vitals:   12/02/17 2102 12/03/17 0553 12/03/17 0931 12/03/17 1337  BP: 125/77 131/74 133/82 102/73  Pulse: (!) 56 (!) 51 60 (!) 54  Resp: 20 20    Temp: 98.5 F (36.9 C) 98 F (36.7 C)  97.6 F (36.4 C)  TempSrc: Oral Oral  Oral  SpO2: 94% 95%  95%    Examination:  General exam: Elderly male, moderately built and nourished lying comfortably propped up in bed Respiratory system: Clear to auscultation. Respiratory effort normal. Cardiovascular system: S1 & S2 heard, RRR. No JVD, murmurs, rubs, gallops or clicks. No pedal edema. Gastrointestinal system: Abdomen is nondistended, soft.  Mild left lower quadrant tenderness without rigidity, guarding or rebound. No organomegaly or masses felt. Normal bowel sounds heard. Central nervous system: Alert and oriented. No focal neurological deficits. Extremities: Symmetric 5 x 5 power. Skin: No rashes, lesions or ulcers Psychiatry: Judgement and insight appear normal. Mood & affect appropriate.     Data Reviewed: I have personally reviewed following labs and imaging studies  CBC: Recent Labs  Lab 12/02/17 1617 12/02/17 1624  WBC 10.5  --   NEUTROABS 7.3  --   HGB 15.9 16.7  HCT 48.1 49.0  MCV 90.9  --  PLT 166  --    Basic Metabolic Panel: Recent Labs  Lab 12/02/17 1617 12/02/17 1624 12/03/17 0601  NA 138 137 137  K 4.3 4.2 3.8  CL 102 99 102  CO2 27  --  26  GLUCOSE 105* 99 142*  BUN 18 21 14   CREATININE 1.45* 1.30* 1.22  CALCIUM 8.9  --  8.2*   Liver Function  Tests: Recent Labs  Lab 12/02/17 1617  AST 24  ALT 22  ALKPHOS 87  BILITOT 1.7*  PROT 7.6  ALBUMIN 3.8   CBG: Recent Labs  Lab 12/02/17 2047 12/03/17 0742 12/03/17 1157 12/03/17 1708  GLUCAP 108* 140* 95 79    No results found for this or any previous visit (from the past 240 hour(s)).       Radiology Studies: Ct Abdomen Pelvis W Contrast  Result Date: 12/02/2017 CLINICAL DATA:  Initial evaluation for acute abdominal pain, suspect diverticulitis. EXAM: CT ABDOMEN AND PELVIS WITH CONTRAST TECHNIQUE: Multidetector CT imaging of the abdomen and pelvis was performed using the standard protocol following bolus administration of intravenous contrast. CONTRAST:  124mL OMNIPAQUE IOHEXOL 300 MG/ML  SOLN COMPARISON:  None available. FINDINGS: Lower chest: Visualized lung bases are clear. Hepatobiliary: Liver demonstrates a normal contrast enhanced appearance. Gallbladder within normal limits. No biliary dilatation. Pancreas: Scattered calcifications within the pancreatic body, likely related to previous/chronic pancreatitis. Pancreas otherwise unremarkable without acute inflammatory changes. Spleen: Spleen within normal limits. Adrenals/Urinary Tract: Adrenal glands are normal. Kidneys equal in size with symmetric enhancement. No nephrolithiasis, hydronephrosis, or focal enhancing renal mass. Few subcentimeter hypodensities within the right kidney too small the characterize. No hydroureter. Partially distended bladder within normal limits. Stomach/Bowel: Stomach within normal limits. No evidence for bowel obstruction. Normal appendix. Prominent inflammatory changes seen about multiple colonic diverticula within the left lower quadrant, consistent with acute diverticulitis. No evidence for abscess. Few tiny locules of air better not definitely position within the colonic lumen suspicious for possible micro perforation (series 7, image 152). No other acute inflammatory changes seen about the  bowels. Vascular/Lymphatic: Extensive aorto bi-iliac atherosclerotic disease. Mesenteric vessels patent proximally. No adenopathy. Reproductive: Prostate enlarged measuring up to 5.9 cm in transverse diameter. Other: No free air or fluid. Small fat containing paraumbilical and bilateral inguinal hernias noted. Musculoskeletal: Chronic fatty atrophy noted within the posterior paraspinous musculature. No acute osseous abnormality. No worrisome lytic or blastic osseous lesions. Multilevel degenerative spondylolysis, most notable at L4-5 and L5-S1. IMPRESSION: 1. Findings consistent with acute sigmoid diverticulitis. Few small locules of gas within this region suspicious for possible early micro perforation. No evidence for abscess. 2. Extensive aortic atherosclerosis. 3. Enlarged prostate. Critical Value/emergent results were called by telephone at the time of interpretation on 12/02/2017 at 5:47 pm to [facility] , who verbally acknowledged these results. Electronically Signed   By: Jeannine Boga M.D.   On: 12/02/2017 17:48        Scheduled Meds: . allopurinol  300 mg Oral Daily  . aspirin  325 mg Oral Daily  . atorvastatin  20 mg Oral Daily  . clopidogrel  75 mg Oral Daily  . enoxaparin (LOVENOX) injection  40 mg Subcutaneous Q24H  . gabapentin  300 mg Oral Daily  . insulin aspart  0-9 Units Subcutaneous TID WC  . metoprolol succinate  50 mg Oral Daily  . pantoprazole  20 mg Oral Daily   Continuous Infusions: . sodium chloride 100 mL/hr at 12/03/17 1651  . ciprofloxacin 0 mL/hr at 12/03/17 0800  .  metronidazole 500 mg (12/03/17 8159)     LOS: 1 day     Vernell Leep, MD, FACP, [facility]. [facility] Pager [phone]  If 7PM-7AM, please contact night-coverage [email or web address] Password [email or web address] 12/03/2017, 5:20 PM

## 2017-12-03 NOTE — H&P (Signed)
CC: LLQ pain, consult by Dr. Denton Brick for evaluation of diverticulitis  HPI: Brendan Callahan is an 72 y.o. male with hx of CAD s/p CABG 2001 and subsequent stents (on plavix), HTN, HLD, DM who presented to ED yesterday with LLQ pain of 2d duration - crampy/achy, persistent. Nothing made it better/worse. The pain was isolated to LLQ and didn't radiate. Never had this before. He had associated chills x3d but denies fevers/nausea/vomiting.  He denies ever having had a colonoscopy  FHx: Denies known FHx of colorectal cancer or polyps  Past Medical History:  Diagnosis Date  . Acute diverticulitis 12/02/2017  . GERD (gastroesophageal reflux disease)   . Gout    "on daily RX" (12/02/2017)  . High cholesterol   . History of kidney stones   . Hypertension   . Type II diabetes mellitus ([place])     Past Surgical History:  Procedure Laterality Date  . BLADDER STONE REMOVAL  ~ 2004  . CORONARY ARTERY BYPASS GRAFT  2001   CABG X3"  . TONSILLECTOMY      Family History  Problem Relation Age of Onset  . Hernia Mother   . Healthy Father     Social:  reports that he has quit smoking. His smoking use included cigarettes. He has a 20.00 pack-year smoking history. He has never used smokeless tobacco. He reports that he drinks alcohol. He reports that he does not use drugs.  Allergies: No Known Allergies  Medications: I have reviewed the patient's current medications.  Results for orders placed or performed during the hospital encounter of 12/02/17 (from the past 48 hour(s))  Comprehensive metabolic panel     Status: Abnormal   Collection Time: 12/02/17  4:17 PM  Result Value Ref Range   Sodium 138 135 - 145 mmol/L   Potassium 4.3 3.5 - 5.1 mmol/L   Chloride 102 98 - 111 mmol/L    Comment: Please note change in reference range.   CO2 27 22 - 32 mmol/L   Glucose, Bld 105 (H) 70 - 99 mg/dL    Comment: Please note change in reference range.   BUN 18 8 - 23 mg/dL    Comment: Please note change  in reference range.   Creatinine, Ser 1.45 (H) 0.61 - 1.24 mg/dL   Calcium 8.9 8.9 - 10.3 mg/dL   Total Protein 7.6 6.5 - 8.1 g/dL   Albumin 3.8 3.5 - 5.0 g/dL   AST 24 15 - 41 U/L   ALT 22 0 - 44 U/L    Comment: Please note change in reference range.   Alkaline Phosphatase 87 38 - 126 U/L   Total Bilirubin 1.7 (H) 0.3 - 1.2 mg/dL   GFR calc non Af Amer 46 (L) >60 mL/min   GFR calc Af Amer 53 (L) >60 mL/min    Comment: (NOTE) The eGFR has been calculated using the CKD EPI equation. This calculation has not been validated in all clinical situations. eGFR's persistently <60 mL/min signify possible Chronic Kidney Disease.    Anion gap 9 5 - 15    Comment: Performed at [facility] [address] [place], [place] [address]  Lipase, blood     Status: None   Collection Time: 12/02/17  4:17 PM  Result Value Ref Range   Lipase 31 11 - 51 U/L    Comment: Performed at [facility] [address], [place], [place] [address]  CBC with Differential     Status: Abnormal  Collection Time: 12/02/17  4:17 PM  Result Value Ref Range   WBC 10.5 4.0 - 10.5 K/uL   RBC 5.29 4.22 - 5.81 MIL/uL   Hemoglobin 15.9 13.0 - 17.0 g/dL   HCT 48.1 39.0 - 52.0 %   MCV 90.9 78.0 - 100.0 fL   MCH 30.1 26.0 - 34.0 pg   MCHC 33.1 30.0 - 36.0 g/dL   RDW 14.6 11.5 - 15.5 %   Platelets 166 150 - 400 K/uL   Neutrophils Relative % 71 %   Neutro Abs 7.3 1.7 - 7.7 K/uL   Lymphocytes Relative 17 %   Lymphs Abs 1.8 0.7 - 4.0 K/uL   Monocytes Relative 11 %   Monocytes Absolute 1.2 (H) 0.1 - 1.0 K/uL   Eosinophils Relative 1 %   Eosinophils Absolute 0.1 0.0 - 0.7 K/uL   Basophils Relative 0 %   Basophils Absolute 0.0 0.0 - 0.1 K/uL   Immature Granulocytes 0 %   Abs Immature Granulocytes 0.0 0.0 - 0.1 K/uL    Comment: Performed at [facility] [address] [place], [place] [address]  I-stat Chem 8, ED     Status: Abnormal   Collection Time: 12/02/17  4:24 PM  Result Value Ref Range    Sodium 137 135 - 145 mmol/L   Potassium 4.2 3.5 - 5.1 mmol/L   Chloride 99 98 - 111 mmol/L   BUN 21 8 - 23 mg/dL   Creatinine, Ser 1.30 (H) 0.61 - 1.24 mg/dL   Glucose, Bld 99 70 - 99 mg/dL   Calcium, Ion 1.10 (L) 1.15 - 1.40 mmol/L   TCO2 25 22 - 32 mmol/L   Hemoglobin 16.7 13.0 - 17.0 g/dL   HCT 49.0 39.0 - 52.0 %  Glucose, capillary     Status: Abnormal   Collection Time: 12/02/17  8:47 PM  Result Value Ref Range   Glucose-Capillary 108 (H) 70 - 99 mg/dL  Basic metabolic panel     Status: Abnormal   Collection Time: 12/03/17  6:01 AM  Result Value Ref Range   Sodium 137 135 - 145 mmol/L   Potassium 3.8 3.5 - 5.1 mmol/L   Chloride 102 98 - 111 mmol/L    Comment: Please note change in reference range.   CO2 26 22 - 32 mmol/L   Glucose, Bld 142 (H) 70 - 99 mg/dL    Comment: Please note change in reference range.   BUN 14 8 - 23 mg/dL    Comment: Please note change in reference range.   Creatinine, Ser 1.22 0.61 - 1.24 mg/dL   Calcium 8.2 (L) 8.9 - 10.3 mg/dL   GFR calc non Af Amer 57 (L) >60 mL/min   GFR calc Af Amer >60 >60 mL/min    Comment: (NOTE) The eGFR has been calculated using the CKD EPI equation. This calculation has not been validated in all clinical situations. eGFR's persistently <60 mL/min signify possible Chronic Kidney Disease.    Anion gap 9 5 - 15    Comment: Performed at [facility] [address] [place], [place] [address]  Glucose, capillary     Status: Abnormal   Collection Time: 12/03/17  7:42 AM  Result Value Ref Range   Glucose-Capillary 140 (H) 70 - 99 mg/dL   Comment 1 Notify RN     Ct Abdomen Pelvis W Contrast  Result Date: 12/02/2017 CLINICAL DATA:  Initial evaluation for acute abdominal pain, suspect diverticulitis. EXAM: CT ABDOMEN AND  PELVIS WITH CONTRAST TECHNIQUE: Multidetector CT imaging of the abdomen and pelvis was performed using the standard protocol following bolus administration of intravenous contrast. CONTRAST:  159m  OMNIPAQUE IOHEXOL 300 MG/ML  SOLN COMPARISON:  None available. FINDINGS: Lower chest: Visualized lung bases are clear. Hepatobiliary: Liver demonstrates a normal contrast enhanced appearance. Gallbladder within normal limits. No biliary dilatation. Pancreas: Scattered calcifications within the pancreatic body, likely related to previous/chronic pancreatitis. Pancreas otherwise unremarkable without acute inflammatory changes. Spleen: Spleen within normal limits. Adrenals/Urinary Tract: Adrenal glands are normal. Kidneys equal in size with symmetric enhancement. No nephrolithiasis, hydronephrosis, or focal enhancing renal mass. Few subcentimeter hypodensities within the right kidney too small the characterize. No hydroureter. Partially distended bladder within normal limits. Stomach/Bowel: Stomach within normal limits. No evidence for bowel obstruction. Normal appendix. Prominent inflammatory changes seen about multiple colonic diverticula within the left lower quadrant, consistent with acute diverticulitis. No evidence for abscess. Few tiny locules of air better not definitely position within the colonic lumen suspicious for possible micro perforation (series 7, image 152). No other acute inflammatory changes seen about the bowels. Vascular/Lymphatic: Extensive aorto bi-iliac atherosclerotic disease. Mesenteric vessels patent proximally. No adenopathy. Reproductive: Prostate enlarged measuring up to 5.9 cm in transverse diameter. Other: No free air or fluid. Small fat containing paraumbilical and bilateral inguinal hernias noted. Musculoskeletal: Chronic fatty atrophy noted within the posterior paraspinous musculature. No acute osseous abnormality. No worrisome lytic or blastic osseous lesions. Multilevel degenerative spondylolysis, most notable at L4-5 and L5-S1. IMPRESSION: 1. Findings consistent with acute sigmoid diverticulitis. Few small locules of gas within this region suspicious for possible early micro  perforation. No evidence for abscess. 2. Extensive aortic atherosclerosis. 3. Enlarged prostate. Critical Value/emergent results were called by telephone at the time of interpretation on 12/02/2017 at 5:47 pm [facility], who verbally acknowledged these results. Electronically Signed   By: BJeannine BogaM.D.   On: 12/02/2017 17:48    ROS - all of the below systems have been reviewed with the patient and positives are indicated with bold text General: chills, fever or night sweats Eyes: blurry vision or double vision ENT: epistaxis or sore throat Allergy/Immunology: itchy/watery eyes or nasal congestion Hematologic/Lymphatic: bleeding problems, blood clots or swollen lymph nodes Endocrine: temperature intolerance or unexpected weight changes Breast: new or changing breast lumps or nipple discharge Resp: cough, shortness of breath, or wheezing CV: chest pain or dyspnea on exertion GI: as per HPI GU: dysuria, trouble voiding, or hematuria MSK: joint pain or joint stiffness Neuro: TIA or stroke symptoms Derm: pruritus and skin lesion changes Psych: anxiety and depression  PE Blood pressure 133/82, pulse 60, temperature 98 F (36.7 C), temperature source Oral, resp. rate 20, SpO2 95 %. Constitutional: NAD; conversant; no deformities Eyes: Moist conjunctiva; no lid lag; anicteric; PERRL Neck: Trachea midline; no thyromegaly Lungs: Normal respiratory effort; no tactile fremitus CV: RRR; no palpable thrills; no pitting edema GI: Abd soft, focally ttp in LLQ, no tenderness elsewhere, no rebound/guarding; nondistended; no palpable hepatosplenomegaly MSK: Normal gait; no clubbing/cyanosis Psychiatric: Appropriate affect; alert and oriented x3 Lymphatic: No palpable cervical or axillary lymphadenopathy  Results for orders placed or performed during the hospital encounter of 12/02/17 (from the past 48 hour(s))  Comprehensive metabolic panel     Status: Abnormal   Collection Time:  12/02/17  4:17 PM  Result Value Ref Range   Sodium 138 135 - 145 mmol/L   Potassium 4.3 3.5 - 5.1 mmol/L   Chloride 102 98 -  111 mmol/L    Comment: Please note change in reference range.   CO2 27 22 - 32 mmol/L   Glucose, Bld 105 (H) 70 - 99 mg/dL    Comment: Please note change in reference range.   BUN 18 8 - 23 mg/dL    Comment: Please note change in reference range.   Creatinine, Ser 1.45 (H) 0.61 - 1.24 mg/dL   Calcium 8.9 8.9 - 10.3 mg/dL   Total Protein 7.6 6.5 - 8.1 g/dL   Albumin 3.8 3.5 - 5.0 g/dL   AST 24 15 - 41 U/L   ALT 22 0 - 44 U/L    Comment: Please note change in reference range.   Alkaline Phosphatase 87 38 - 126 U/L   Total Bilirubin 1.7 (H) 0.3 - 1.2 mg/dL   GFR calc non Af Amer 46 (L) >60 mL/min   GFR calc Af Amer 53 (L) >60 mL/min    Comment: (NOTE) The eGFR has been calculated using the CKD EPI equation. This calculation has not been validated in all clinical situations. eGFR's persistently <60 mL/min signify possible Chronic Kidney Disease.    Anion gap 9 5 - 15    Comment: Performed at [facility] [address] [place], [place] [address]  Lipase, blood     Status: None   Collection Time: 12/02/17  4:17 PM  Result Value Ref Range   Lipase 31 11 - 51 U/L    Comment: Performed at [facility] [address], [place], [place] [address]  CBC with Differential     Status: Abnormal   Collection Time: 12/02/17  4:17 PM  Result Value Ref Range   WBC 10.5 4.0 - 10.5 K/uL   RBC 5.29 4.22 - 5.81 MIL/uL   Hemoglobin 15.9 13.0 - 17.0 g/dL   HCT 48.1 39.0 - 52.0 %   MCV 90.9 78.0 - 100.0 fL   MCH 30.1 26.0 - 34.0 pg   MCHC 33.1 30.0 - 36.0 g/dL   RDW 14.6 11.5 - 15.5 %   Platelets 166 150 - 400 K/uL   Neutrophils Relative % 71 %   Neutro Abs 7.3 1.7 - 7.7 K/uL   Lymphocytes Relative 17 %   Lymphs Abs 1.8 0.7 - 4.0 K/uL   Monocytes Relative 11 %   Monocytes Absolute 1.2 (H) 0.1 - 1.0 K/uL   Eosinophils Relative 1 %   Eosinophils Absolute  0.1 0.0 - 0.7 K/uL   Basophils Relative 0 %   Basophils Absolute 0.0 0.0 - 0.1 K/uL   Immature Granulocytes 0 %   Abs Immature Granulocytes 0.0 0.0 - 0.1 K/uL    Comment: Performed at [facility] [address], [place], [place] [address]  I-stat Chem 8, ED     Status: Abnormal   Collection Time: 12/02/17  4:24 PM  Result Value Ref Range   Sodium 137 135 - 145 mmol/L   Potassium 4.2 3.5 - 5.1 mmol/L   Chloride 99 98 - 111 mmol/L   BUN 21 8 - 23 mg/dL   Creatinine, Ser 1.30 (H) 0.61 - 1.24 mg/dL   Glucose, Bld 99 70 - 99 mg/dL   Calcium, Ion 1.10 (L) 1.15 - 1.40 mmol/L   TCO2 25 22 - 32 mmol/L   Hemoglobin 16.7 13.0 - 17.0 g/dL   HCT 49.0 39.0 - 52.0 %  Glucose, capillary     Status: Abnormal   Collection Time: 12/02/17  8:47 PM  Result  Value Ref Range   Glucose-Capillary 108 (H) 70 - 99 mg/dL  Basic metabolic panel     Status: Abnormal   Collection Time: 12/03/17  6:01 AM  Result Value Ref Range   Sodium 137 135 - 145 mmol/L   Potassium 3.8 3.5 - 5.1 mmol/L   Chloride 102 98 - 111 mmol/L    Comment: Please note change in reference range.   CO2 26 22 - 32 mmol/L   Glucose, Bld 142 (H) 70 - 99 mg/dL    Comment: Please note change in reference range.   BUN 14 8 - 23 mg/dL    Comment: Please note change in reference range.   Creatinine, Ser 1.22 0.61 - 1.24 mg/dL   Calcium 8.2 (L) 8.9 - 10.3 mg/dL   GFR calc non Af Amer 57 (L) >60 mL/min   GFR calc Af Amer >60 >60 mL/min    Comment: (NOTE) The eGFR has been calculated using the CKD EPI equation. This calculation has not been validated in all clinical situations. eGFR's persistently <60 mL/min signify possible Chronic Kidney Disease.    Anion gap 9 5 - 15    Comment: Performed at [facility] [address], [place], [place] [address]  Glucose, capillary     Status: Abnormal   Collection Time: 12/03/17  7:42 AM  Result Value Ref Range   Glucose-Capillary 140 (H) 70 - 99 mg/dL   Comment 1 Notify RN      Ct Abdomen Pelvis W Contrast  Result Date: 12/02/2017 CLINICAL DATA:  Initial evaluation for acute abdominal pain, suspect diverticulitis. EXAM: CT ABDOMEN AND PELVIS WITH CONTRAST TECHNIQUE: Multidetector CT imaging of the abdomen and pelvis was performed using the standard protocol following bolus administration of intravenous contrast. CONTRAST:  179m OMNIPAQUE IOHEXOL 300 MG/ML  SOLN COMPARISON:  None available. FINDINGS: Lower chest: Visualized lung bases are clear. Hepatobiliary: Liver demonstrates a normal contrast enhanced appearance. Gallbladder within normal limits. No biliary dilatation. Pancreas: Scattered calcifications within the pancreatic body, likely related to previous/chronic pancreatitis. Pancreas otherwise unremarkable without acute inflammatory changes. Spleen: Spleen within normal limits. Adrenals/Urinary Tract: Adrenal glands are normal. Kidneys equal in size with symmetric enhancement. No nephrolithiasis, hydronephrosis, or focal enhancing renal mass. Few subcentimeter hypodensities within the right kidney too small the characterize. No hydroureter. Partially distended bladder within normal limits. Stomach/Bowel: Stomach within normal limits. No evidence for bowel obstruction. Normal appendix. Prominent inflammatory changes seen about multiple colonic diverticula within the left lower quadrant, consistent with acute diverticulitis. No evidence for abscess. Few tiny locules of air better not definitely position within the colonic lumen suspicious for possible micro perforation (series 7, image 152). No other acute inflammatory changes seen about the bowels. Vascular/Lymphatic: Extensive aorto bi-iliac atherosclerotic disease. Mesenteric vessels patent proximally. No adenopathy. Reproductive: Prostate enlarged measuring up to 5.9 cm in transverse diameter. Other: No free air or fluid. Small fat containing paraumbilical and bilateral inguinal hernias noted. Musculoskeletal: Chronic  fatty atrophy noted within the posterior paraspinous musculature. No acute osseous abnormality. No worrisome lytic or blastic osseous lesions. Multilevel degenerative spondylolysis, most notable at L4-5 and L5-S1. IMPRESSION: 1. Findings consistent with acute sigmoid diverticulitis. Few small locules of gas within this region suspicious for possible early micro perforation. No evidence for abscess. 2. Extensive aortic atherosclerosis. 3. Enlarged prostate. Critical Value/emergent results were called by telephone at the time of interpretation on 12/02/2017 at 5:47 pm [facility], who verbally acknowledged these results. Electronically Signed   By:  Jeannine Boga M.D.   On: 12/02/2017 17:48    A/P: Jennifer Holland is an 72 y.o. male with hx of CAD s/p CABG 2001 and subsequent stents (on plavix), HTN, HLD, DM admitted with acute diverticulitis and ?microperforation  -AF, VS normal, isolated LLQ tenderness; WBC normal. No planned surgical intervention at this juncture -Continue broad spec IV abx -CLD, MIVF -Would hold on advancing diet further until pain free -Our service will follow with you -The anatomy and physiology of the GI tract was discussed at length with the patient. The pathophysiology of diverticulitis was discussed as was the treatment approach to diverticulitis. -Assuming he progresses as expected, will need colonoscopy 6-8wks out from resolution of [place]. Dema Severin, M.D. [facility]

## 2017-12-03 NOTE — Progress Notes (Signed)
Received pt. Alert and oriented. Pt ambulated to his bed. Denies pain or SOB at this time. Oriented pt to his room and use of call light. Will monitor pt.

## 2017-12-04 LAB — GLUCOSE, CAPILLARY
Glucose-Capillary: 95 mg/dL (ref 70–99)
Glucose-Capillary: 124 mg/dL — ABNORMAL HIGH (ref 70–99)
Glucose-Capillary: 84 mg/dL (ref 70–99)
Glucose-Capillary: 113 mg/dL — ABNORMAL HIGH (ref 70–99)

## 2017-12-04 LAB — CBC
HCT: 44.2 % (ref 39.0–52.0)
Hemoglobin: 14.4 g/dL (ref 13.0–17.0)
MCH: 30 pg (ref 26.0–34.0)
MCHC: 32.6 g/dL (ref 30.0–36.0)
MCV: 92.1 fL (ref 78.0–100.0)
PLATELETS: 150 10*3/uL (ref 150–400)
RBC: 4.8 MIL/uL (ref 4.22–5.81)
RDW: 14.3 % (ref 11.5–15.5)
WBC: 5.8 10*3/uL (ref 4.0–10.5)

## 2017-12-04 LAB — BASIC METABOLIC PANEL
Anion gap: 6 (ref 5–15)
BUN: 11 mg/dL (ref 8–23)
CHLORIDE: 106 mmol/L (ref 98–111)
CO2: 27 mmol/L (ref 22–32)
Calcium: 7.9 mg/dL — ABNORMAL LOW (ref 8.9–10.3)
Creatinine, Ser: 1.24 mg/dL (ref 0.61–1.24)
GFR calc Af Amer: >60 mL/min (ref >60)
GFR, EST NON AFRICAN AMERICAN: 56 mL/min — AB (ref >60)
Glucose, Bld: 133 mg/dL — ABNORMAL HIGH (ref 70–99)
Potassium: 3.8 mmol/L (ref 3.5–5.1)
SODIUM: 139 mmol/L (ref 135–145)

## 2017-12-04 MED ORDER — DOCUSATE SODIUM 100 MG PO CAPS
100.0000 mg | ORAL_CAPSULE | Freq: Two times a day (BID) | ORAL | Status: DC
  Administered 2017-12-04 – 2017-12-05 (×3): 100 mg via ORAL
  Filled 2017-12-04 (×3): qty 1

## 2017-12-04 NOTE — Plan of Care (Signed)
Nutrition Education Note  RD consulted for nutrition education regarding nutrition management for diverticulosis/ Diverticulitis.   Spoke with pt at bedside, who is very eager to go home as soon as possible. He consumed some of his clear liquid tray due to dislike of most items. He is a little apprehensive to try solid foods, due to fear of abdominal pain.   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 He is very interested in diet management for diverticulitis and grateful for RD visit.    RD provided "Fiber Restricted Nutrition Therapy" handout from the Academy of [employer]. Reviewed patient's dietary recall and discussed ways for pt to meet nutrition goals over the next several weeks. Explained reasons for pt to follow a low fiber diet over the next 4-6 weeks. Reviewed low fiber foods and high fiber foods. Discussed best practice for long term management of diverticulosis is a high fiber diet and discussed ways to gradually increase fiber in the diet.   Teach back method used. Pt verbalizes understanding of information provided.   Expect good compliance.  Current diet order is soft, patient is consuming approximately n/a of meals at this time. Labs and medications reviewed. No further nutrition interventions warranted at this time. RD contact information provided. If additional nutrition issues arise, please re-consult RD.  Kanya Potteiger A. Jimmye Norman, RD, LDN, CDE Pager: [phone] After hours Pager: [phone]

## 2017-12-04 NOTE — Progress Notes (Signed)
[facility] Progress Note     Subjective: CC: wants to go home Patient wants to eat and get out of here. He has tolerated CLD without issue. No longer having pain. Denies nausea. Passing flatus. This is his first bout of diverticulitis.   Objective: Vital signs in last 24 hours: Temp:  [97.3 F (36.3 C)-98.4 F (36.9 C)] 97.3 F (36.3 C) (06/28 0510) Pulse Rate:  [48-60] 56 (06/28 0510) Resp:  [17] 17 (06/28 0510) BP: (102-138)/(73-83) 135/83 (06/28 0510) SpO2:  [93 %-95 %] 94 % (06/28 0510) Last BM Date: 12/01/17  Intake/Output from previous day: 06/27 0701 - 06/28 0700 In: 3301.2 [P.O.:770; I.V.:2007.4; IV Piggyback:523.8] Out: -  Intake/Output this shift: No intake/output data recorded.  PE: Gen:  Alert, NAD, pleasant Card:  Regular rate and rhythm, pedal pulses 2+ BL Pulm:  Normal effort, clear to auscultation bilaterally Abd: Soft, non-tender, non-distended, bowel sounds present in all 4 quadrants, no HSM Skin: warm and dry, no rashes  Psych: A&Ox3   Lab Results:  Recent Labs    12/02/17 1617 12/02/17 1624 12/04/17 0659  WBC 10.5  --  5.8  HGB 15.9 16.7 14.4  HCT 48.1 49.0 44.2  PLT 166  --  150   BMET Recent Labs    12/03/17 0601 12/04/17 0659  NA 137 139  K 3.8 3.8  CL 102 106  CO2 26 27  GLUCOSE 142* 133*  BUN 14 11  CREATININE 1.22 1.24  CALCIUM 8.2* 7.9*   PT/INR No results for input(s): LABPROT, INR in the last 72 hours. CMP     Component Value Date/Time   NA 139 12/04/2017 0659   K 3.8 12/04/2017 0659   CL 106 12/04/2017 0659   CO2 27 12/04/2017 0659   GLUCOSE 133 (H) 12/04/2017 0659   BUN 11 12/04/2017 0659   CREATININE 1.24 12/04/2017 0659   CALCIUM 7.9 (L) 12/04/2017 0659   PROT 7.6 12/02/2017 1617   ALBUMIN 3.8 12/02/2017 1617   AST 24 12/02/2017 1617   ALT 22 12/02/2017 1617   ALKPHOS 87 12/02/2017 1617   BILITOT 1.7 (H) 12/02/2017 1617   GFRNONAA 56 (L) 12/04/2017 0659   GFRAA >60 12/04/2017 0659   Lipase      Component Value Date/Time   LIPASE 31 12/02/2017 1617       Studies/Results: Ct Abdomen Pelvis W Contrast  Result Date: 12/02/2017 CLINICAL DATA:  Initial evaluation for acute abdominal pain, suspect diverticulitis. EXAM: CT ABDOMEN AND PELVIS WITH CONTRAST TECHNIQUE: Multidetector CT imaging of the abdomen and pelvis was performed using the standard protocol following bolus administration of intravenous contrast. CONTRAST:  155mL OMNIPAQUE IOHEXOL 300 MG/ML  SOLN COMPARISON:  None available. FINDINGS: Lower chest: Visualized lung bases are clear. Hepatobiliary: Liver demonstrates a normal contrast enhanced appearance. Gallbladder within normal limits. No biliary dilatation. Pancreas: Scattered calcifications within the pancreatic body, likely related to previous/chronic pancreatitis. Pancreas otherwise unremarkable without acute inflammatory changes. Spleen: Spleen within normal limits. Adrenals/Urinary Tract: Adrenal glands are normal. Kidneys equal in size with symmetric enhancement. No nephrolithiasis, hydronephrosis, or focal enhancing renal mass. Few subcentimeter hypodensities within the right kidney too small the characterize. No hydroureter. Partially distended bladder within normal limits. Stomach/Bowel: Stomach within normal limits. No evidence for bowel obstruction. Normal appendix. Prominent inflammatory changes seen about multiple colonic diverticula within the left lower quadrant, consistent with acute diverticulitis. No evidence for abscess. Few tiny locules of air better not definitely position within the colonic lumen suspicious for possible  micro perforation (series 7, image 152). No other acute inflammatory changes seen about the bowels. Vascular/Lymphatic: Extensive aorto bi-iliac atherosclerotic disease. Mesenteric vessels patent proximally. No adenopathy. Reproductive: Prostate enlarged measuring up to 5.9 cm in transverse diameter. Other: No free air or fluid. Small fat  containing paraumbilical and bilateral inguinal hernias noted. Musculoskeletal: Chronic fatty atrophy noted within the posterior paraspinous musculature. No acute osseous abnormality. No worrisome lytic or blastic osseous lesions. Multilevel degenerative spondylolysis, most notable at L4-5 and L5-S1. IMPRESSION: 1. Findings consistent with acute sigmoid diverticulitis. Few small locules of gas within this region suspicious for possible early micro perforation. No evidence for abscess. 2. Extensive aortic atherosclerosis. 3. Enlarged prostate. Critical Value/emergent results were called by telephone at the time of interpretation on 12/02/2017 at 5:47 pm to [facility] , who verbally acknowledged these results. Electronically Signed   By: Jeannine Boga M.D.   On: 12/02/2017 17:48    Anti-infectives: Anti-infectives (From admission, onward)   Start     Dose/Rate Route Frequency Ordered Stop   12/03/17 0200  metroNIDAZOLE (FLAGYL) IVPB 500 mg     500 mg 100 mL/hr over 60 Minutes Intravenous Every 8 hours 12/02/17 2006     12/02/17 1845  ciprofloxacin (CIPRO) IVPB 400 mg     400 mg 200 mL/hr over 60 Minutes Intravenous  Once 12/02/17 1837 12/02/17 1954   12/02/17 1845  metroNIDAZOLE (FLAGYL) IVPB 500 mg     500 mg 100 mL/hr over 60 Minutes Intravenous  Once 12/02/17 1837 12/02/17 1952   12/02/17 0600  ciprofloxacin (CIPRO) IVPB 400 mg     400 mg 200 mL/hr over 60 Minutes Intravenous Every 12 hours 12/02/17 2006         Assessment/Plan CAD s/p CABG - 2001, on plavix HTN Heart murmur T2DM with neuropathy GERD AKI Gout  Acute sigmoid diverticulitis with microperforation - WBC 5.8, afebrile - continue abx - recommend PO cipro/flagyl at d/c for 10-14 days - advance diet to fulls and soft at dinner if tolerating FLD - Assuming he progresses as expected, will need colonoscopy 6-8wks out from resolution of sxs  FEN: CLD, IVF VTE: SCDs, lovenox ID: cipro/flagyl 6/26>>  Advance diet.  Add colace for bowel regimen. May d/c later tonight vs tomorrow AM if tolerating soft diet.    LOS: 2 days    Brigid Re , [facility] 12/04/2017, 8:08 AM Pager: [phone] Consults: [phone] Mon-Fri 7:00 am-4:30 pm Sat-Sun 7:00 am-11:30 am

## 2017-12-04 NOTE — Progress Notes (Signed)
PROGRESS NOTE   Brendan Callahan  IZT:[id number]    DOB: 18-Nov-1945    DOA: 12/02/2017  PCP: Vira Blanco, MD   I have briefly reviewed patients previous medical records in [facility].  Brief Narrative:  72 year old male with PMH of CAD status post CABG, HTN, DM, gout, GERD presented to the ED with 2 days history of LLQ abdominal pain and CT abdomen confirmed acute sigmoid diverticulitis with few small locules of gas within this region suspicious for possible early microperforation.  Admitted for management.  General surgery consulted.  Improving.   Assessment & Plan:   Principal Problem:   Acute diverticulitis Active Problems:   CAD (coronary artery disease)   DM (diabetes mellitus) (HCC)   Gout   GERD (gastroesophageal reflux disease)   Acute sigmoid diverticulitis with possible microperforation but no abscess: Treating supportively with bowel rest/continue clear liquids, IV metronidazole and ciprofloxacin, IV fluids.  Given suspected microperforation on CT, general surgery was consulted, agree with management, no planned surgical intervention at this time, recommend holding on advancing diet further until pain-free.  Patient will need screening colonoscopy 6 to 8 weeks after complete resolution of symptoms.  He has never had colonoscopy before.  Improving.  Gradually advancing diet.  Acute kidney injury on stage III chronic kidney disease: Presented with creatinine of 1.45.  Previous creatinine 06/2016: 0.9.  Hydrating with IV fluids.  Improved to 1.22.  Creatinine stable.  CAD status post CABG: Asymptomatic and stable.  Continue aspirin, Plavix, Toprol-XL and atorvastatin.  Stable  Type II DM with neuropathy: Holding metformin.  Continue SSI.  Good inpatient control.  Continue gabapentin  Essential hypertension: Controlled.  Continue Toprol-XL.  Heart murmur: Patient unaware.  Outpatient follow-up with PCP.  GERD: Continue PPI.  Gout: No acute flare  currently.   DVT prophylaxis: Lovenox Code Status: Full Family Communication: None at bedside Disposition: DC home pending clinical improvement, possibly 6/29.   Consultants:  General surgery  Procedures:  None  Antimicrobials:  IV Cipro and Flagyl 6/26 >   Subjective: LLQ abdominal pain significantly improved, down to 1-2/10 and intermittent.  Hungry.  Seen this morning and wanting to advance diet.  No nausea or vomiting.  ROS: As above.  Objective:  Vitals:   12/03/17 1337 12/03/17 2044 12/04/17 0510 12/04/17 1404  BP: 102/73 138/74 135/83 132/75  Pulse: (!) 54 (!) 48 (!) 56 (!) 53  Resp:  17 17 18   Temp: 97.6 F (36.4 C) 98.4 F (36.9 C) (!) 97.3 F (36.3 C) (!) 97.4 F (36.3 C)  TempSrc: Oral Oral Oral Oral  SpO2: 95% 93% 94% 97%    Examination:  General exam: Elderly male, moderately built and nourished lying comfortably propped up in bed Respiratory system: Clear to auscultation. Respiratory effort normal.  Stable Cardiovascular system: S1 & S2 heard, RRR. No JVD, murmurs, rubs, gallops or clicks. No pedal edema.  Stable Gastrointestinal system: Abdomen is nondistended, soft.  No further left lower quadrant tenderness. No organomegaly or masses felt. Normal bowel sounds heard. Central nervous system: Alert and oriented. No focal neurological deficits.  Stable Extremities: Symmetric 5 x 5 power. Skin: No rashes, lesions or ulcers Psychiatry: Judgement and insight appear normal. Mood & affect appropriate.     Data Reviewed: I have personally reviewed following labs and imaging studies  CBC: Recent Labs  Lab 12/02/17 1617 12/02/17 1624 12/04/17 0659  WBC 10.5  --  5.8  NEUTROABS 7.3  --   --   HGB 15.9  16.7 14.4  HCT 48.1 49.0 44.2  MCV 90.9  --  92.1  PLT 166  --  604   Basic Metabolic Panel: Recent Labs  Lab 12/02/17 1617 12/02/17 1624 12/03/17 0601 12/04/17 0659  NA 138 137 137 139  K 4.3 4.2 3.8 3.8  CL 102 99 102 106  CO2 27  --   26 27  GLUCOSE 105* 99 142* 133*  BUN 18 21 14 11   CREATININE 1.45* 1.30* 1.22 1.24  CALCIUM 8.9  --  8.2* 7.9*   Liver Function Tests: Recent Labs  Lab 12/02/17 1617  AST 24  ALT 22  ALKPHOS 87  BILITOT 1.7*  PROT 7.6  ALBUMIN 3.8   CBG: Recent Labs  Lab 12/03/17 1708 12/03/17 2042 12/04/17 0806 12/04/17 1210 12/04/17 1718  GLUCAP 79 97 95 124* 84    No results found for this or any previous visit (from the past 240 hour(s)).       Radiology Studies: No results found.      Scheduled Meds: . allopurinol  300 mg Oral Daily  . aspirin  325 mg Oral Daily  . atorvastatin  20 mg Oral Daily  . clopidogrel  75 mg Oral Daily  . docusate sodium  100 mg Oral BID  . enoxaparin (LOVENOX) injection  40 mg Subcutaneous Q24H  . gabapentin  300 mg Oral Daily  . insulin aspart  0-9 Units Subcutaneous TID WC  . metoprolol succinate  50 mg Oral Daily  . pantoprazole  20 mg Oral Daily   Continuous Infusions: . ciprofloxacin 400 mg (12/04/17 1722)  . metronidazole Stopped (12/04/17 1143)     LOS: 2 days     Vernell Leep, MD, FACP, [facility]. [facility] Pager [phone]  If 7PM-7AM, please contact night-coverage [email or web address] Password [email or web address] 12/04/2017, 6:22 PM

## 2017-12-05 DIAGNOSIS — E118 Type 2 diabetes mellitus with unspecified complications: Secondary | ICD-10-CM

## 2017-12-05 LAB — GLUCOSE, CAPILLARY: GLUCOSE-CAPILLARY: 93 mg/dL (ref 70–99)

## 2017-12-05 MED ORDER — CIPROFLOXACIN HCL 500 MG PO TABS
500.0000 mg | ORAL_TABLET | Freq: Two times a day (BID) | ORAL | Status: DC
  Administered 2017-12-05: 500 mg via ORAL
  Filled 2017-12-05: qty 1

## 2017-12-05 MED ORDER — METRONIDAZOLE 500 MG PO TABS: 500.0000 mg | ORAL_TABLET | Freq: Three times a day (TID) | ORAL | Status: DC

## 2017-12-05 MED ORDER — CIPROFLOXACIN HCL 500 MG PO TABS: 500.0000 mg | ORAL_TABLET | Freq: Two times a day (BID) | ORAL | 0 refills | Status: AC

## 2017-12-05 MED ORDER — METRONIDAZOLE 500 MG PO TABS: 500.0000 mg | ORAL_TABLET | Freq: Three times a day (TID) | ORAL | 0 refills | Status: AC

## 2017-12-05 MED ORDER — DOCUSATE SODIUM 100 MG PO CAPS: 100.0000 mg | ORAL_CAPSULE | Freq: Two times a day (BID) | ORAL | 0 refills | Status: DC

## 2017-12-05 NOTE — Progress Notes (Signed)
Discharged home today. Personal belongings,discharged instructions and Doctor's Note given to patient. Verbalized understanding of instructions

## 2017-12-05 NOTE — Discharge Instructions (Signed)
Please get your medications reviewed and adjusted by your Primary MD. ° °Please request your Primary MD to go over all Hospital Tests and Procedure/Radiological results at the follow up, please get all Hospital records sent to your Prim MD by signing hospital release before you go home. ° °If you had Pneumonia of Lung problems at the Hospital: °Please get a 2 view Chest X ray done in 6-8 weeks after hospital discharge or sooner if instructed by your Primary MD. ° °If you have Congestive Heart Failure: °Please call your Cardiologist or Primary MD anytime you have any of the following symptoms:  °1) 3 pound weight gain in 24 hours or 5 pounds in 1 week  °2) shortness of breath, with or without a dry hacking cough  °3) swelling in the hands, feet or stomach  °4) if you have to sleep on extra pillows at night in order to breathe ° °Follow cardiac low salt diet and 1.5 lit/day fluid restriction. ° °If you have diabetes °Accuchecks 4 times/day, Once in AM empty stomach and then before each meal. °Log in all results and show them to your primary doctor at your next visit. °If any glucose reading is under 80 or above 300 call your primary MD immediately. ° °If you have Seizure/Convulsions/Epilepsy: °Please do not drive, operate heavy machinery, participate in activities at heights or participate in high speed sports until you have seen by Primary MD or a Neurologist and advised to do so again. ° °If you had Gastrointestinal Bleeding: °Please ask your Primary MD to check a complete blood count within one week of discharge or at your next visit. Your endoscopic/colonoscopic biopsies that are pending at the time of discharge, will also need to followed by your Primary MD. ° °Get Medicines reviewed and adjusted. °Please take all your medications with you for your next visit with your Primary MD ° °Please request your Primary MD to go over all hospital tests and procedure/radiological results at the follow up, please ask your  Primary MD to get all Hospital records sent to his/her office. ° °If you experience worsening of your admission symptoms, develop shortness of breath, life threatening emergency, suicidal or homicidal thoughts you must seek medical attention immediately by calling 911 or calling your MD immediately  if symptoms less severe. ° °You must read complete instructions/literature along with all the possible adverse reactions/side effects for all the Medicines you take and that have been prescribed to you. Take any new Medicines after you have completely understood and accpet all the possible adverse reactions/side effects.  ° °Do not drive or operate heavy machinery when taking Pain medications.  ° °Do not take more than prescribed Pain, Sleep and Anxiety Medications ° °Special Instructions: If you have smoked or chewed Tobacco  in the last 2 yrs please stop smoking, stop any regular Alcohol  and or any Recreational drug use. ° °Wear Seat belts while driving. ° °Please note °You were cared for by a hospitalist during your hospital stay. If you have any questions about your discharge medications or the care you received while you were in the hospital after you are discharged, you can call the unit and asked to speak with the hospitalist on call if the hospitalist that took care of you is not available. Once you are discharged, your primary care physician will handle any further medical issues. Please note that NO REFILLS for any discharge medications will be authorized once you are discharged, as it is imperative that you   return to your primary care physician (or establish a relationship with a primary care physician if you do not have one) for your aftercare needs so that they can reassess your need for medications and monitor your lab values. ° °You can reach the hospitalist office at phone [phone] or fax [phone] °  °If you do not have a primary care physician, you can call [phone] for a physician  referral. ° ° °Diverticulitis °Diverticulitis is infection or inflammation of small pouches (diverticula) in the colon that form due to a condition called diverticulosis. Diverticula can trap stool (feces) and bacteria, causing infection and inflammation. °Diverticulitis may cause severe stomach pain and diarrhea. It may lead to tissue damage in the colon that causes bleeding. The diverticula may also burst (rupture) and cause infected stool to enter other areas of the abdomen. °Complications of diverticulitis can include: °· Bleeding. °· Severe infection. °· Severe pain. °· Rupture (perforation) of the colon. °· Blockage (obstruction) of the colon. ° °What are the causes? °This condition is caused by stool becoming trapped in the diverticula, which allows bacteria to grow in the diverticula. This leads to inflammation and infection. °What increases the risk? °You are more likely to develop this condition if: °· You have diverticulosis. The risk for diverticulosis increases if: °? You are overweight or obese. °? You use tobacco products. °? You do not get enough exercise. °· You eat a diet that does not include enough fiber. High-fiber foods include fruits, vegetables, beans, nuts, and whole grains. ° °What are the signs or symptoms? °Symptoms of this condition may include: °· Pain and tenderness in the abdomen. The pain is normally located on the left side of the abdomen, but it may occur in other areas. °· Fever and chills. °· Bloating. °· Cramping. °· Nausea. °· Vomiting. °· Changes in bowel routines. °· Blood in your stool. ° °How is this diagnosed? °This condition is diagnosed based on: °· Your medical history. °· A physical exam. °· Tests to make sure there is nothing else causing your condition. These tests may include: °? Blood tests. °? Urine tests. °? Imaging tests of the abdomen, including X-rays, ultrasounds, MRIs, or CT scans. ° °How is this treated? °Most cases of this condition are mild and can be  treated at home. Treatment may include: °· Taking over-the-counter pain medicines. °· Following a clear liquid diet. °· Taking antibiotic medicines by mouth. °· Rest. ° °More severe cases may need to be treated at a hospital. Treatment may include: °· Not eating or drinking. °· Taking prescription pain medicine. °· Receiving antibiotic medicines through an IV tube. °· Receiving fluids and nutrition through an IV tube. °· Surgery. ° °When your condition is under control, your health care provider may recommend that you have a colonoscopy. This is an exam to look at the entire large intestine. During the exam, a lubricated, bendable tube is inserted into the anus and then passed into the rectum, colon, and other parts of the large intestine. A colonoscopy can show how severe your diverticula are and whether something else may be causing your symptoms. °Follow these instructions at home: °Medicines °· Take over-the-counter and prescription medicines only as told by your health care provider. These include fiber supplements, probiotics, and stool softeners. °· If you were prescribed an antibiotic medicine, take it as told by your health care provider. Do not stop taking the antibiotic even if you start to feel better. °· Do not drive or use heavy machinery while   taking prescription pain medicine. °General instructions °· Follow a full liquid diet or another diet as directed by your health care provider. After your symptoms improve, your health care provider may tell you to change your diet. He or she may recommend that you eat a diet that contains at least 25 g (25 grams) of fiber daily. Fiber makes it easier to pass stool. Healthy sources of fiber include: °? Berries. One cup contains 4-8 grams of fiber. °? Beans or lentils. One half cup contains 5-8 grams of fiber. °? Green vegetables. One cup contains 4 grams of fiber. °· Exercise for at least 30 minutes, 3 times each week. You should exercise hard enough to raise  your heart rate and break a sweat. °· Keep all follow-up visits as told by your health care provider. This is important. You may need a colonoscopy. °Contact a health care provider if: °· Your pain does not improve. °· You have a hard time drinking or eating food. °· Your bowel movements do not return to normal. °Get help right away if: °· Your pain gets worse. °· Your symptoms do not get better with treatment. °· Your symptoms suddenly get worse. °· You have a fever. °· You vomit more than one time. °· You have stools that are bloody, black, or tarry. °Summary °· Diverticulitis is infection or inflammation of small pouches (diverticula) in the colon that form due to a condition called diverticulosis. Diverticula can trap stool (feces) and bacteria, causing infection and inflammation. °· You are at higher risk for this condition if you have diverticulosis and you eat a diet that does not include enough fiber. °· Most cases of this condition are mild and can be treated at home. More severe cases may need to be treated at a hospital. °· When your condition is under control, your health care provider may recommend that you have an exam called a colonoscopy. This exam can show how severe your diverticula are and whether something else may be causing your symptoms. °This information is not intended to replace advice given to you by your health care provider. Make sure you discuss any questions you have with your health care provider. °Document Released: 03/05/2005 Document Revised: 06/28/2016 Document Reviewed: 06/28/2016 °Elsevier Interactive Patient Education © 2018 [employer] ° °

## 2017-12-05 NOTE — Progress Notes (Signed)
  Subjective: Doing well.  Denies pain.  Having some bowel movements.  Tolerating solid diet.  Wants to go home.  Objective: Vital signs in last 24 hours: Temp:  [97.4 F (36.3 C)-97.5 F (36.4 C)] 97.5 F (36.4 C) (06/29 0418) Pulse Rate:  [52-61] 61 (06/29 0418) Resp:  [17-18] 17 (06/29 0418) BP: (132-154)/(75-96) 154/96 (06/29 0418) SpO2:  [94 %-98 %] 94 % (06/29 0418) Last BM Date: 12/04/17  Intake/Output from previous day: 06/28 0701 - 06/29 0700 In: 1280 [P.O.:680; IV Piggyback:600] Out: 1925 [Urine:1925] Intake/Output this shift: No intake/output data recorded.   PE: Gen:  Alert, NAD, pleasant Card:  Regular rate and rhythm, pedal pulses 2+ BL Pulm:  Normal effort, clear to auscultation bilaterally Abd: Soft, non-tender, non-distended, bowel sounds present in all 4 quadrants, no HSM Skin: warm and dry, no rashes  Psych: A&Ox3      Lab Results:  Recent Labs    12/02/17 1617 12/02/17 1624 12/04/17 0659  WBC 10.5  --  5.8  HGB 15.9 16.7 14.4  HCT 48.1 49.0 44.2  PLT 166  --  150   BMET Recent Labs    12/03/17 0601 12/04/17 0659  NA 137 139  K 3.8 3.8  CL 102 106  CO2 26 27  GLUCOSE 142* 133*  BUN 14 11  CREATININE 1.22 1.24  CALCIUM 8.2* 7.9*   PT/INR No results for input(s): LABPROT, INR in the last 72 hours. ABG No results for input(s): PHART, HCO3 in the last 72 hours.  Invalid input(s): PCO2, PO2  Studies/Results: No results found.  Anti-infectives: Anti-infectives (From admission, onward)   Start     Dose/Rate Route Frequency Ordered Stop   12/05/17 0800  ciprofloxacin (CIPRO) tablet 500 mg     500 mg Oral 2 times daily 12/05/17 0644     12/05/17 0645  metroNIDAZOLE (FLAGYL) tablet 500 mg     500 mg Oral Every 8 hours 12/05/17 0644     12/03/17 0200  metroNIDAZOLE (FLAGYL) IVPB 500 mg  Status:  Discontinued     500 mg 100 mL/hr over 60 Minutes Intravenous Every 8 hours 12/02/17 2006 12/05/17 0644   12/02/17 1845  ciprofloxacin  (CIPRO) IVPB 400 mg     400 mg 200 mL/hr over 60 Minutes Intravenous  Once 12/02/17 1837 12/02/17 1954   12/02/17 1845  metroNIDAZOLE (FLAGYL) IVPB 500 mg     500 mg 100 mL/hr over 60 Minutes Intravenous  Once 12/02/17 1837 12/02/17 1952   12/02/17 0600  ciprofloxacin (CIPRO) IVPB 400 mg  Status:  Discontinued     400 mg 200 mL/hr over 60 Minutes Intravenous Every 12 hours 12/02/17 2006 12/05/17 0644      Assessment/Plan:  CAD s/p CABG - 2001, on plavix HTN Heart murmur T2DM with neuropathy GERD AKI Gout  Acute sigmoid diverticulitis with microperforation---first episode - WBC 5.8, afebrile -I have switched his antibiotics from IV to p.o. Cipro and Flagyl - Soft diet He meets all discharge criteria and may be discharged from a surgical standpoint -Recommend p.o. Cipro and Flagyl for 10 to 14 days -Recommend soft diet -Recommend follow-up with gastroenterology for colonoscopy in about 8 weeks -Follow-up with surgery if necessary  Surgery will sign off  FEN: CLD, IVF VTE: SCDs, lovenox ID: cipro/flagyl 6/26>> .      LOS: 3 days    Adin Hector 12/05/2017

## 2017-12-05 NOTE — Discharge Summary (Signed)
Physician Discharge Summary  Brendan Callahan ASN:[id number] DOB: 1946-03-24  PCP: Vira Blanco, MD  Admit date: 12/02/2017 Discharge date: 12/05/2017  Recommendations for Outpatient Follow-up:  1. Dr. Vira Blanco, PCP in 1 week with repeat labs (CBC & BMP). 2. Patient has been provided contact details for [facility] to try and arrange for outpatient screening colonoscopy in approximately 6 to 8 weeks. 3. Dr. Nadeen Landau, General Surgery: As needed.  Home Health: None Equipment/Devices: None  Discharge Condition: Improved and stable CODE STATUS: Full code Diet recommendation: Heart healthy and diabetic diet.  Soft diet for a couple of weeks and then gradually advance to high-fiber diet.  Discharge Diagnoses:  Principal Problem:   Acute diverticulitis Active Problems:   CAD (coronary artery disease)   DM (diabetes mellitus) ([place])   Gout   GERD (gastroesophageal reflux disease)   Brief Summary: 72 year old male with PMH of CAD status post CABG, HTN, DM, gout, GERD presented to the ED with 2 days history of LLQ abdominal pain and CT abdomen confirmed acute sigmoid diverticulitis with few small locules of gas within this region suspicious for possible early microperforation.  Admitted for management.  General surgery consulted.    Assessment & Plan:   Acute sigmoid diverticulitis with possible microperforation but no abscess: He was treated supportively with bowel, IV metronidazole and ciprofloxacin, IV fluids.  Given suspected microperforation on CT, general surgery was consulted who agreed with management, no planned surgical intervention at this time.  As his pain improved and he clinically improved, his diet was gradually advanced.  He has tolerated soft diet without difficulty.  Abdominal pain has almost resolved.  General surgery has seen him today and signed off.  He has completed 3 days of IV antibiotics.  He will be discharged on  oral antibiotics to complete total 14 days course.  He has never had a colonoscopy-states that he has had issues with getting to appointments.  He was provided with addresses to 2 local GI services so that he can coordinate screening colonoscopy in 6 to 8 weeks.  Acute kidney injury on stage III chronic kidney disease: Presented with creatinine of 1.45.  Previous creatinine 06/2016: 0.9.  Hydrating with IV fluids.  Improved to 1.22.  Creatinine stable.  Follow BMP in a few days as outpatient.  CAD status post CABG: Asymptomatic and stable.  Continue aspirin, Plavix, Toprol-XL and atorvastatin.  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  In the hospital he was treated with SSI while metformin was held.  Resume metformin at discharge.  Essential hypertension:  Mostly controlled.  Continue Toprol-XL.  Heart murmur: Patient unaware.  Outpatient follow-up with PCP.  GERD: Continue PPI.  Gout: No acute flare currently.  Erectile dysfunction: Both Viagra and Cialis were listed on his home medication list.  He reports taking Viagra because it is generic.  Discontinued Cialis.    Consultants:  General surgery  Procedures:  None    Discharge Instructions  Discharge Instructions    Call MD for:  difficulty breathing, headache or visual disturbances   Complete by:  As directed    Call MD for:  extreme fatigue   Complete by:  As directed    Call MD for:  persistant dizziness or light-headedness   Complete by:  As directed    Call MD for:  persistant nausea and vomiting   Complete by:  As directed    Call MD for:  severe uncontrolled pain   Complete  by:  As directed    Call MD for:  temperature >100.4   Complete by:  As directed    Diet - low sodium heart healthy   Complete by:  As directed    Soft diet for a couple of weeks then gradually advance to high-fiber diet.   Diet Carb Modified   Complete by:  As directed    Increase activity slowly   Complete by:   As directed        Medication List    STOP taking these medications   CIALIS 20 MG tablet Generic drug:  tadalafil     TAKE these medications   allopurinol 300 MG tablet Commonly known as:  ZYLOPRIM Take 300 mg by mouth daily.   aspirin 325 MG tablet Take 325 mg by mouth daily.   atorvastatin 20 MG tablet Commonly known as:  LIPITOR Take 20 mg by mouth daily.   ciprofloxacin 500 MG tablet Commonly known as:  CIPRO Take 1 tablet (500 mg total) by mouth 2 (two) times daily for 11 days.   clopidogrel 75 MG tablet Commonly known as:  PLAVIX Take 75 mg by mouth daily.   docusate sodium 100 MG capsule Commonly known as:  COLACE Take 1 capsule (100 mg total) by mouth 2 (two) times daily.   gabapentin 300 MG capsule Commonly known as:  NEURONTIN Take 300 mg by mouth daily.   metFORMIN 500 MG tablet Commonly known as:  GLUCOPHAGE Take 500 mg by mouth daily.   metoprolol succinate 50 MG 24 hr tablet Commonly known as:  TOPROL-XL Take 50 mg by mouth daily.   metroNIDAZOLE 500 MG tablet Commonly known as:  FLAGYL Take 1 tablet (500 mg total) by mouth every 8 (eight) hours for 11 days.   multivitamin with minerals Tabs tablet Take 1 tablet by mouth daily.   pantoprazole 20 MG tablet Commonly known as:  PROTONIX Take 20 mg by mouth daily.   sildenafil 100 MG tablet Commonly known as:  VIAGRA Take 100 mg by mouth daily as needed for erectile dysfunction.   vitamin B-12 100 MCG tablet Commonly known as:  CYANOCOBALAMIN Take 100 mcg by mouth daily.      Follow-up Information    [facility] Follow up.   Specialty:  Gastroenterology Why:  You can schedule a colonoscopy 6-8 weeks from discharge.  Contact information: [place] [address] [phone]       [facility] Follow up.   Why:  You can schedule a colonoscopy 6-8 weeks from discharge.  Contact information: [place] [address] [phone]        Ileana Roup, MD Follow up.   Specialty:  General Surgery Why:  May follow up after colonoscopy if interested in discussing elective colon resection for diverticulitis. Contact information: [place] [address] [phone]        Vira Blanco, MD. Schedule an appointment as soon as possible for a visit in 1 week(s).   Specialty:  Family Medicine Why:  To be seen with repeat labs (CBC & BMP). Contact information: [place] [address] [phone]          No Known Allergies    Procedures/Studies: Ct Abdomen Pelvis W Contrast  Result Date: 12/02/2017 CLINICAL DATA:  Initial evaluation for acute abdominal pain, suspect diverticulitis. EXAM: CT ABDOMEN AND PELVIS WITH CONTRAST TECHNIQUE: Multidetector CT imaging of the abdomen and  pelvis was performed using the standard protocol following bolus administration of intravenous contrast. CONTRAST:  173mL OMNIPAQUE IOHEXOL 300 MG/ML  SOLN COMPARISON:  None available. FINDINGS: Lower chest: Visualized lung bases are clear. Hepatobiliary: Liver demonstrates a normal contrast enhanced appearance. Gallbladder within normal limits. No biliary dilatation. Pancreas: Scattered calcifications within the pancreatic body, likely related to previous/chronic pancreatitis. Pancreas otherwise unremarkable without acute inflammatory changes. Spleen: Spleen within normal limits. Adrenals/Urinary Tract: Adrenal glands are normal. Kidneys equal in size with symmetric enhancement. No nephrolithiasis, hydronephrosis, or focal enhancing renal mass. Few subcentimeter hypodensities within the right kidney too small the characterize. No hydroureter. Partially distended bladder within normal limits. Stomach/Bowel: Stomach within normal limits. No evidence for bowel obstruction. Normal appendix. Prominent inflammatory changes seen about multiple colonic  diverticula within the left lower quadrant, consistent with acute diverticulitis. No evidence for abscess. Few tiny locules of air better not definitely position within the colonic lumen suspicious for possible micro perforation (series 7, image 152). No other acute inflammatory changes seen about the bowels. Vascular/Lymphatic: Extensive aorto bi-iliac atherosclerotic disease. Mesenteric vessels patent proximally. No adenopathy. Reproductive: Prostate enlarged measuring up to 5.9 cm in transverse diameter. Other: No free air or fluid. Small fat containing paraumbilical and bilateral inguinal hernias noted. Musculoskeletal: Chronic fatty atrophy noted within the posterior paraspinous musculature. No acute osseous abnormality. No worrisome lytic or blastic osseous lesions. Multilevel degenerative spondylolysis, most notable at L4-5 and L5-S1. IMPRESSION: 1. Findings consistent with acute sigmoid diverticulitis. Few small locules of gas within this region suspicious for possible early micro perforation. No evidence for abscess. 2. Extensive aortic atherosclerosis. 3. Enlarged prostate. Critical Value/emergent results were called by telephone at the time of interpretation on 12/02/2017 at 5:47 pm to [facility] , who verbally acknowledged these results. Electronically Signed   By: Jeannine Boga M.D.   On: 12/02/2017 17:48      Subjective: Patient anxious to go home.  Indicates that his abdominal pain has almost resolved.  Tolerating soft diet.  Having normal BMs.  No bleeding.  No nausea or vomiting.  As per RN, no acute issues noted.  Patient states that he has been ambulating in the room.  Discharge Exam:  Vitals:   12/04/17 0510 12/04/17 1404 12/04/17 2032 12/05/17 0418  BP: 135/83 132/75 140/81 (!) 154/96  Pulse: (!) 56 (!) 53 (!) 52 61  Resp: 17 18 17 17   Temp: (!) 97.3 F (36.3 C) (!) 97.4 F (36.3 C)  (!) 97.5 F (36.4 C)  TempSrc: Oral Oral  Oral  SpO2: 94% 97% 98% 94%    General  exam: Elderly male, moderately built and nourished lying comfortably propped up in bed Respiratory system: Clear to auscultation. Respiratory effort normal.   Cardiovascular system: S1 & S2 heard, RRR. No JVD, murmurs, rubs, gallops or clicks. No pedal edema.   Gastrointestinal system: Abdomen is nondistended, soft.  Nontender. No organomegaly or masses felt. Normal bowel sounds heard. Central nervous system: Alert and oriented. No focal neurological deficits.  Extremities: Symmetric 5 x 5 power. Skin: No rashes, lesions or ulcers Psychiatry: Judgement and insight appear normal. Mood & affect appropriate.       The results of significant diagnostics from this hospitalization (including imaging, microbiology, ancillary and laboratory) are listed below for reference.     Microbiology: No results found for this or any previous visit (from the past 240 hour(s)).   Labs: CBC: Recent Labs  Lab 12/02/17 1617 12/02/17 1624 12/04/17 0659  WBC 10.5  --  5.8  NEUTROABS 7.3  --   --   HGB 15.9 16.7 14.4  HCT 48.1 49.0 44.2  MCV 90.9  --  92.1  PLT 166  --  809   Basic Metabolic Panel: Recent Labs  Lab 12/02/17 1617 12/02/17 1624 12/03/17 0601 12/04/17 0659  NA 138 137 137 139  K 4.3 4.2 3.8 3.8  CL 102 99 102 106  CO2 27  --  26 27  GLUCOSE 105* 99 142* 133*  BUN 18 21 14 11   CREATININE 1.45* 1.30* 1.22 1.24  CALCIUM 8.9  --  8.2* 7.9*   Liver Function Tests: Recent Labs  Lab 12/02/17 1617  AST 24  ALT 22  ALKPHOS 87  BILITOT 1.7*  PROT 7.6  ALBUMIN 3.8   CBG: Recent Labs  Lab 12/04/17 0806 12/04/17 1210 12/04/17 1718 12/04/17 2030 12/05/17 0742  GLUCAP 95 124* 84 113* 93   Urinalysis    Component Value Date/Time   LABSPEC >=1.030 12/02/2017 1503   PHURINE 5.5 12/02/2017 1503   GLUCOSEU 100 (A) 12/02/2017 1503   HGBUR NEGATIVE 12/02/2017 1503   BILIRUBINUR NEGATIVE 12/02/2017 1503   KETONESUR NEGATIVE 12/02/2017 1503   PROTEINUR NEGATIVE 12/02/2017  1503   UROBILINOGEN 1.0 12/02/2017 1503   NITRITE NEGATIVE 12/02/2017 1503   LEUKOCYTESUR NEGATIVE 12/02/2017 1503      Time coordinating discharge: 25 minutes  SIGNED:  Vernell Leep, MD, FACP, [facility]. [facility] Pager [phone]  If 7PM-7AM, please contact night-coverage [email or web address] Password [facility] 12/05/2017, 11:44 AM

## 2018-01-25 DIAGNOSIS — G5793 Unspecified mononeuropathy of bilateral lower limbs: Secondary | ICD-10-CM | POA: Insufficient documentation

## 2018-05-04 ENCOUNTER — Encounter (HOSPITAL_COMMUNITY): Payer: Self-pay | Admitting: Emergency Medicine

## 2018-05-04 ENCOUNTER — Ambulatory Visit (HOSPITAL_COMMUNITY)
Admission: EM | Admit: 2018-05-04 | Discharge: 2018-05-04 | Disposition: A | Discharge status: Home / Self Care | Payer: Medicare HMO | Attending: Family Medicine | Admitting: Family Medicine

## 2018-05-04 ENCOUNTER — Ambulatory Visit (INDEPENDENT_AMBULATORY_CARE_PROVIDER_SITE_OTHER): Payer: Medicare HMO

## 2018-05-04 DIAGNOSIS — J181 Lobar pneumonia, unspecified organism: Secondary | ICD-10-CM | POA: Diagnosis not present

## 2018-05-04 DIAGNOSIS — J189 Pneumonia, unspecified organism: Secondary | ICD-10-CM

## 2018-05-04 DIAGNOSIS — R0781 Pleurodynia: Secondary | ICD-10-CM | POA: Diagnosis not present

## 2018-05-04 MED ORDER — AZITHROMYCIN 250 MG PO TABS: 250.0000 mg | ORAL_TABLET | Freq: Every day | ORAL | 0 refills | Status: DC

## 2018-05-04 MED ORDER — IBUPROFEN 800 MG PO TABS: 800.0000 mg | ORAL_TABLET | Freq: Three times a day (TID) | ORAL | 0 refills | Status: DC

## 2018-05-04 NOTE — ED Triage Notes (Signed)
Pt here for left upper side back pain worse with movement and inspiration x 3 days

## 2018-05-04 NOTE — ED Provider Notes (Signed)
[facility]    CSN: [id number] Arrival date & time: 05/04/18  1006     History   Chief Complaint Chief Complaint  Patient presents with  . Back Pain    HPI Brendan Callahan is a 72 y.o. male.   HPI   Patient is here for pain in his back.  Just below his left shoulder blade.  It hurts with a deep breath.  Hurts with a cough or sneeze.  Certain movements hurt as well.  He had no accident or injury.  No heavy lifting.  He is never had back or rib pain before.  No underlying COPD or asthma.  He has not had any coughing, wheezing, shortness of breath.  No sputum production.  No fever or chills.  He does feel a bit tired and achy.  He thinks this is because he is been working.  Past Medical History:  Diagnosis Date  . Acute diverticulitis 12/02/2017  . GERD (gastroesophageal reflux disease)   . Gout    "on daily RX" (12/02/2017)  . High cholesterol   . History of kidney stones   . Hypertension   . Type II diabetes mellitus [facility])     Patient Active Problem List   Diagnosis Date Noted  . Acute diverticulitis 12/02/2017  . CAD (coronary artery disease) 12/02/2017  . DM (diabetes mellitus) ([place]) 12/02/2017  . Gout 12/02/2017  . GERD (gastroesophageal reflux disease) 12/02/2017    Past Surgical History:  Procedure Laterality Date  . BLADDER STONE REMOVAL  ~ 2004  . CORONARY ARTERY BYPASS GRAFT  2001   CABG X3"  . TONSILLECTOMY         Home Medications    Prior to Admission medications   Medication Sig Start Date End Date Taking? Authorizing Provider  allopurinol (ZYLOPRIM) 300 MG tablet Take 300 mg by mouth daily. 05/20/16 02/09/18  [provider]  aspirin 325 MG tablet Take 325 mg by mouth daily. 08/06/10   [provider]  atorvastatin (LIPITOR) 20 MG tablet Take 20 mg by mouth daily. 10/03/15 02/09/18  [provider]  azithromycin (ZITHROMAX) 250 MG tablet Take 1 tablet (250 mg total) by mouth daily. Take first 2 tablets together,  then 1 every day until finished. 05/04/18   Raylene Everts, MD  docusate sodium (COLACE) 100 MG capsule Take 1 capsule (100 mg total) by mouth 2 (two) times daily. 12/05/17   Hongalgi, Lenis Dickinson, MD  gabapentin (NEURONTIN) 300 MG capsule Take 300 mg by mouth daily. 11/11/17 02/09/18  [provider]  ibuprofen (ADVIL,MOTRIN) 800 MG tablet Take 1 tablet (800 mg total) by mouth 3 (three) times daily. 05/04/18   Raylene Everts, MD  metFORMIN (GLUCOPHAGE) 500 MG tablet Take 500 mg by mouth daily. 05/17/16 06/04/18  [provider]  metoprolol succinate (TOPROL-XL) 50 MG 24 hr tablet Take 50 mg by mouth daily. 10/01/15 02/09/18  [provider]  Multiple Vitamin (MULTIVITAMIN WITH MINERALS) TABS tablet Take 1 tablet by mouth daily.    [provider]  pantoprazole (PROTONIX) 20 MG tablet Take 20 mg by mouth daily. 06/04/17 06/04/18  [provider]  sildenafil (VIAGRA) 100 MG tablet Take 100 mg by mouth daily as needed for erectile dysfunction. 10/01/15 02/09/18  [provider]  vitamin B-12 (CYANOCOBALAMIN) 100 MCG tablet Take 100 mcg by mouth daily.    [provider]    Family History Family History  Problem Relation Age of Onset  . Hernia Mother   .  Healthy Father     Social History Social History   Tobacco Use  . Smoking status: Former Smoker    Packs/day: 1.00    Years: 20.00    Pack years: 20.00    Types: Cigarettes  . Smokeless tobacco: Never Used  . Tobacco comment: 12/02/2017 "nothing since 1980s"  Substance Use Topics  . Alcohol use: Yes    Frequency: Never    Comment: 12/02/2017 "couple beers/month"  . Drug use: Never     Allergies   Patient has no known allergies.   Review of Systems Review of Systems  Constitutional: Negative for chills and fever.  HENT: Negative for ear pain and sore throat.   Eyes: Negative for pain and visual disturbance.  Respiratory: Positive for chest tightness. Negative for cough  and shortness of breath.   Cardiovascular: Positive for chest pain. Negative for palpitations and leg swelling.  Gastrointestinal: Negative for abdominal pain and vomiting.  Genitourinary: Negative for dysuria and hematuria.  Musculoskeletal: Negative for arthralgias and back pain.  Skin: Negative for color change and rash.  Neurological: Negative for seizures and syncope.  All other systems reviewed and are negative.    Physical Exam Triage Vital Signs ED Triage Vitals  Enc Vitals Group     BP 05/04/18 1134 (!) 168/99     Pulse Rate 05/04/18 1134 65     Resp 05/04/18 1134 18     Temp 05/04/18 1134 97.7 F (36.5 C)     Temp Source 05/04/18 1134 Oral     SpO2 05/04/18 1134 97 %     Weight --      Height --      Head Circumference --      Peak Flow --      Pain Score 05/04/18 1135 8     Pain Loc --      Pain Edu? --      Excl. in [place]? --    No data found.  Updated Vital Signs BP (!) 168/99 (BP Location: Right Arm)   Pulse 65   Temp 97.7 F (36.5 C) (Oral)   Resp 18   SpO2 97%        Physical Exam  Constitutional: He appears well-developed and well-nourished. He appears distressed.  Guarded movements.  Avoids deep breath.  HENT:  Head: Normocephalic and atraumatic.  Right Ear: External ear normal.  Left Ear: External ear normal.  Mouth/Throat: Oropharynx is clear and moist.  Eyes: Pupils are equal, round, and reactive to light. Conjunctivae are normal.  Neck: Normal range of motion.  Cardiovascular: Normal rate, regular rhythm and normal heart sounds.  Pulmonary/Chest: Effort normal and breath sounds normal. No respiratory distress. He has no wheezes. He has no rales. He exhibits no tenderness.  Abdominal: Soft. He exhibits no distension.  Musculoskeletal: Normal range of motion. He exhibits no edema.  No tenderness over thoracic spine.  No tenderness over posterior ribs.  No pain with range of motion of both upper extremities.  Neurological: He is alert.  Skin:  Skin is warm and dry.  Psychiatric: He has a normal mood and affect. His behavior is normal.     UC Treatments / Results  Labs (all labs ordered are listed, but only abnormal results are displayed) Labs Reviewed - No data to display  EKG None  Radiology Dg Chest 2 View  Result Date: 05/04/2018 CLINICAL DATA:  Left posterior ribcage pain with pleuritic component. Duration of symptoms 3 days. Former smoker. History of previous  CABG. No known injury. EXAM: CHEST - 2 VIEW COMPARISON:  None. FINDINGS: There is mild elevation of the right hemidiaphragm. The lungs are reasonably well inflated. There is minimal increased density at the left lung base posteriorly. There is no pleural effusion or pneumothorax. The heart and pulmonary vascularity are normal. The sternal wires are intact. There is calcification in the wall of the aortic arch. There is mild multilevel degenerative disc disease of the thoracic spine. IMPRESSION: Previous CABG. No CHF. Probable left basilar atelectasis or early pneumonia. No pleural effusion. Followup PA and lateral chest X-ray is recommended in 3-4 weeks following trial of antibiotic therapy to ensure resolution and exclude underlying malignancy. Thoracic aortic atherosclerosis. Electronically Signed   By: Raziel  Martinique M.D.   On: 05/04/2018 12:48    Procedures Procedures (including critical care time)  Medications Ordered in UC Medications - No data to display  Initial Impression / Assessment and Plan / UC Course  I have reviewed the triage vital signs and the nursing notes.  Pertinent labs & imaging results that were available during my care of the patient were reviewed by me and considered in my medical decision making (see chart for details).     Not the usual clinical presentation for pneumonia, but given his x-ray findings I think treatment is appropriate.  I am also going to give him ibuprofen to take for anti-inflammatory pain in case there is a  musculoskeletal or pleuritic component.  He will follow-up with his PCP next week. Final Clinical Impressions(s) / UC Diagnoses   Final diagnoses:  Pneumonia of left lower lobe due to infectious organism [facility])     Discharge Instructions     Get plenty of rest  Drink lots of fluids Take ibuprofen 2-3 times a day with food.  This is for pain and inflammation. Take the antibiotic as prescribed. You need to follow-up with your physician in 3 to 4 weeks for a repeat chest x-ray Return here or to the ER if you feel worse instead of better   ED Prescriptions    Medication Sig Dispense Auth. Provider   azithromycin (ZITHROMAX) 250 MG tablet Take 1 tablet (250 mg total) by mouth daily. Take first 2 tablets together, then 1 every day until finished. 6 tablet Raylene Everts, MD   ibuprofen (ADVIL,MOTRIN) 800 MG tablet Take 1 tablet (800 mg total) by mouth 3 (three) times daily. 21 tablet Raylene Everts, MD     Controlled Substance Prescriptions [place] Controlled Substance Registry consulted? Not Applicable   Raylene Everts, MD 05/04/18 2020

## 2018-05-04 NOTE — Discharge Instructions (Signed)
Get plenty of rest  Drink lots of fluids Take ibuprofen 2-3 times a day with food.  This is for pain and inflammation. Take the antibiotic as prescribed. You need to follow-up with your physician in 3 to 4 weeks for a repeat chest x-ray Return here or to the ER if you feel worse instead of better

## 2018-07-06 DIAGNOSIS — Z0000 Encounter for general adult medical examination without abnormal findings: Secondary | ICD-10-CM | POA: Diagnosis not present

## 2018-07-06 DIAGNOSIS — I700 Atherosclerosis of aorta: Secondary | ICD-10-CM | POA: Diagnosis not present

## 2018-07-06 DIAGNOSIS — M109 Gout, unspecified: Secondary | ICD-10-CM | POA: Diagnosis not present

## 2018-07-06 DIAGNOSIS — Z1159 Encounter for screening for other viral diseases: Secondary | ICD-10-CM | POA: Diagnosis not present

## 2018-07-06 DIAGNOSIS — E119 Type 2 diabetes mellitus without complications: Secondary | ICD-10-CM | POA: Diagnosis not present

## 2018-07-06 DIAGNOSIS — I10 Essential (primary) hypertension: Secondary | ICD-10-CM | POA: Diagnosis not present

## 2018-07-06 DIAGNOSIS — I25708 Atherosclerosis of coronary artery bypass graft(s), unspecified, with other forms of angina pectoris: Secondary | ICD-10-CM | POA: Diagnosis not present

## 2018-07-06 DIAGNOSIS — E538 Deficiency of other specified B group vitamins: Secondary | ICD-10-CM | POA: Diagnosis not present

## 2018-07-06 DIAGNOSIS — R5383 Other fatigue: Secondary | ICD-10-CM | POA: Diagnosis not present

## 2018-07-06 DIAGNOSIS — Z79899 Other long term (current) drug therapy: Secondary | ICD-10-CM | POA: Diagnosis not present

## 2018-07-06 DIAGNOSIS — E559 Vitamin D deficiency, unspecified: Secondary | ICD-10-CM | POA: Diagnosis not present

## 2019-11-28 IMAGING — CT CT ABD-PELV W/ CM
2 of 5 series · 16 of 46 positions shown, 18 images · IV contrast (Omni 300)
Comparison: None available.

CLINICAL DATA: Initial evaluation for acute abdominal pain, suspect
diverticulitis.

EXAM:
CT ABDOMEN AND PELVIS WITH CONTRAST
TECHNIQUE: Multidetector CT imaging of the abdomen and pelvis was performed
using the standard protocol following bolus administration of
intravenous contrast.
CONTRAST:  100mL OMNIPAQUE IOHEXOL 300 MG/ML  SOLN

[Series 3: a/p w/ 5mm · axial · 0.85mm/px · z∈[+719,+1189]mm · 13 of 104 slices shown, 15 images]
[im 5/104  soft-tissue]
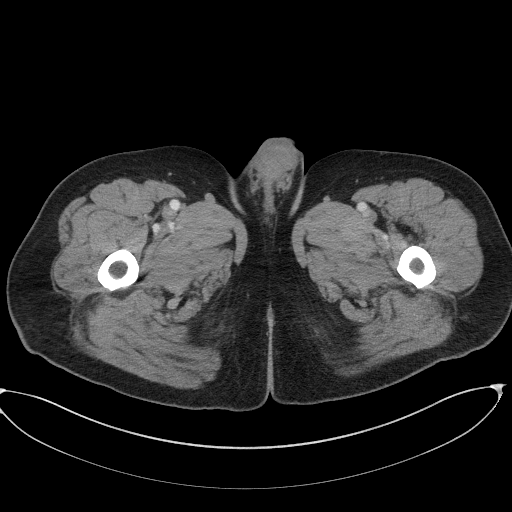
[im 5/104  bone]
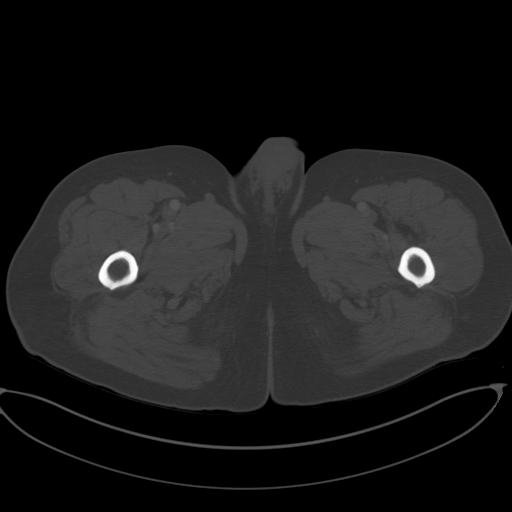
[im 15/104  soft-tissue]
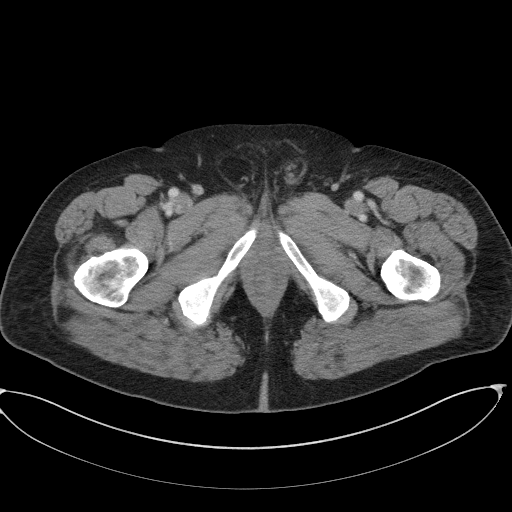
[im 20/104  soft-tissue]
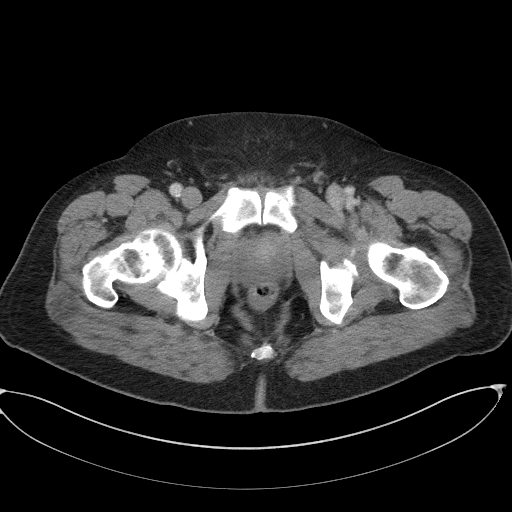
[im 30/104  soft-tissue]
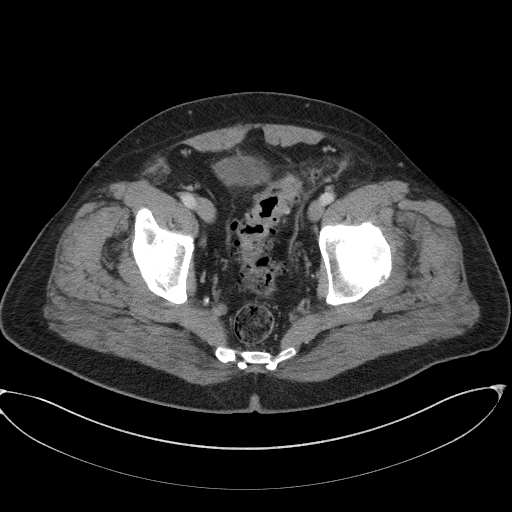
[im 35/104  soft-tissue]
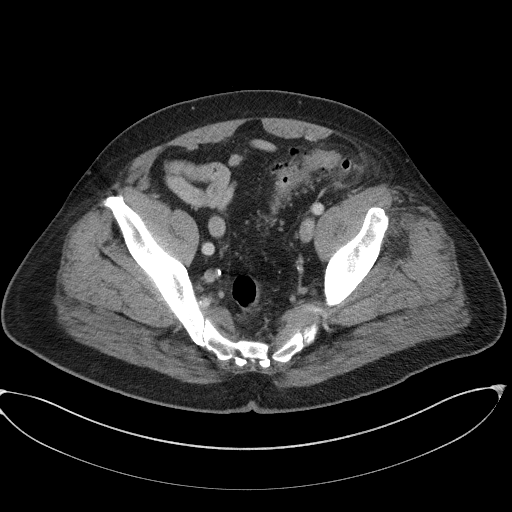
[im 45/104  soft-tissue]
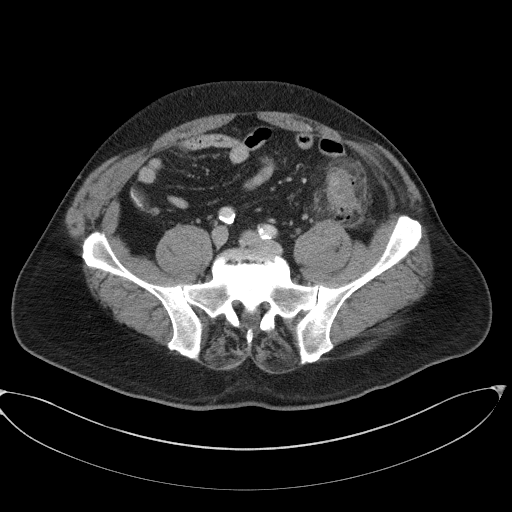
[im 54/104  soft-tissue]
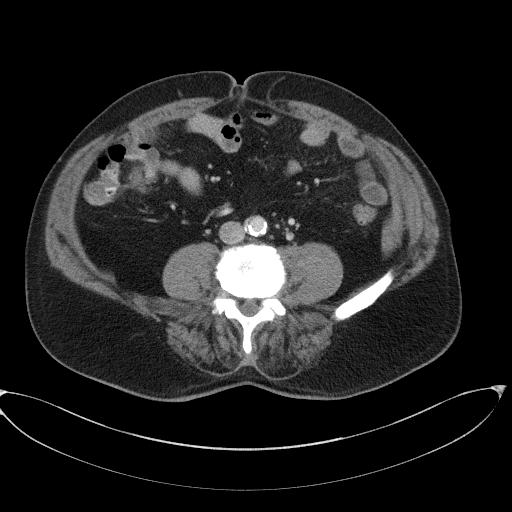
[im 59/104  soft-tissue]
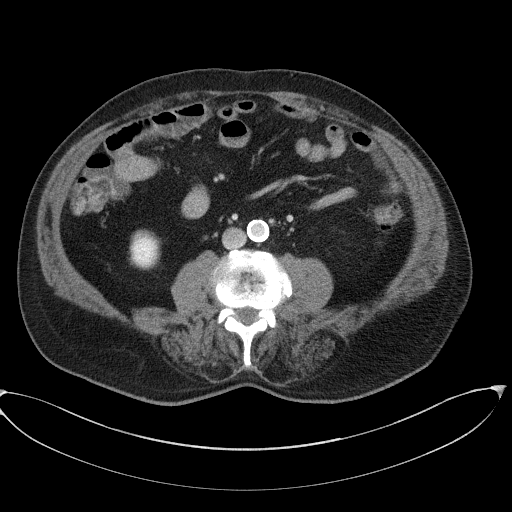
[im 69/104  soft-tissue]
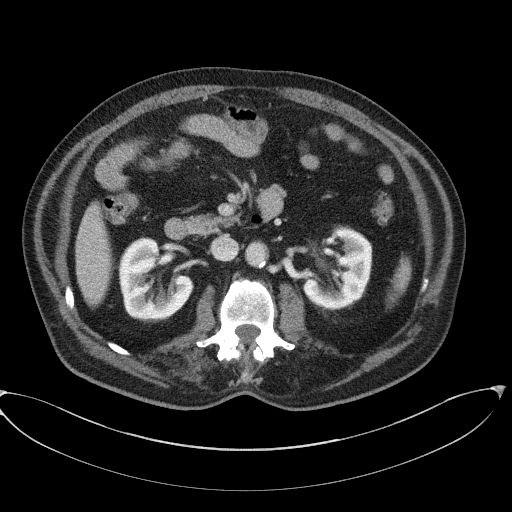
[im 69/104  bone]
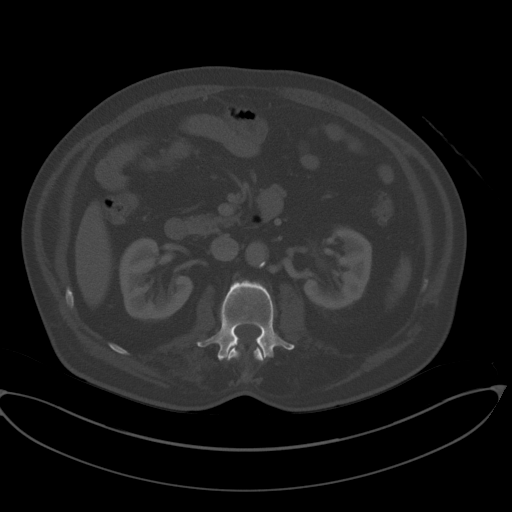
[im 74/104  soft-tissue]
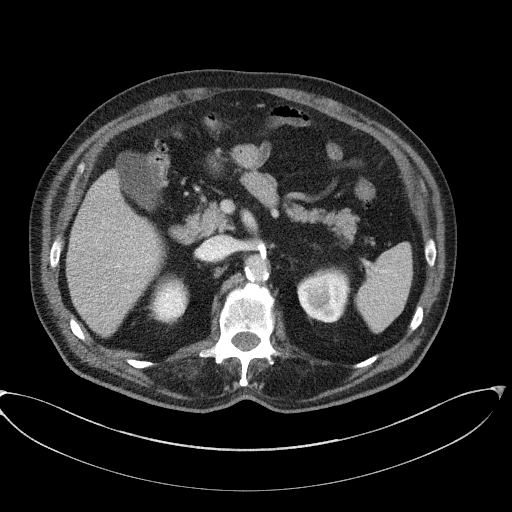
[im 84/104  soft-tissue]
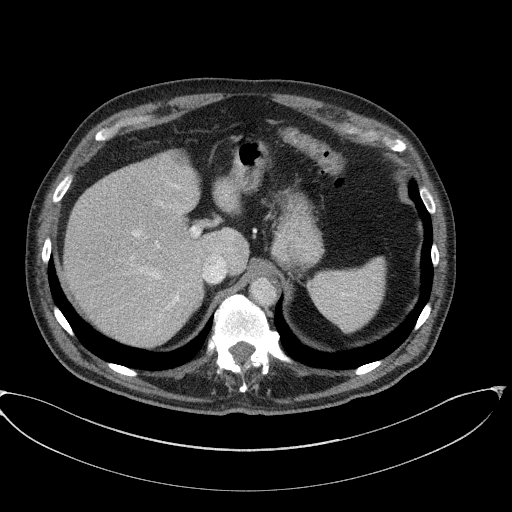
[im 89/104  soft-tissue]
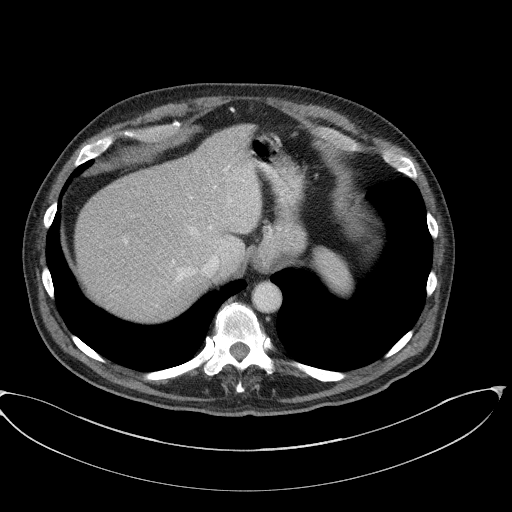
[im 99/104  soft-tissue]
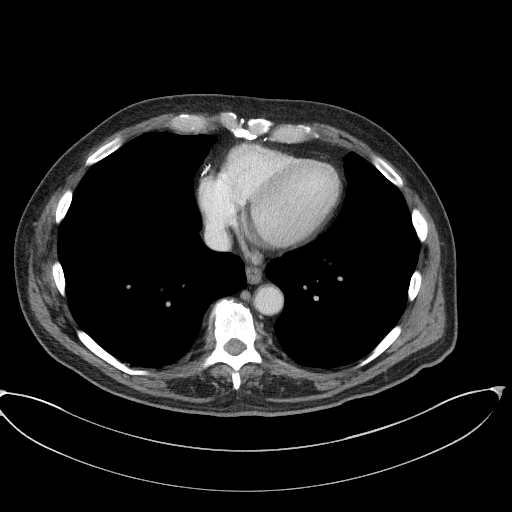

[Series 6: a/p w/ cor · coronal · 0.90mm/px · 3 of 155 slices shown]
[im 52/155  soft-tissue]
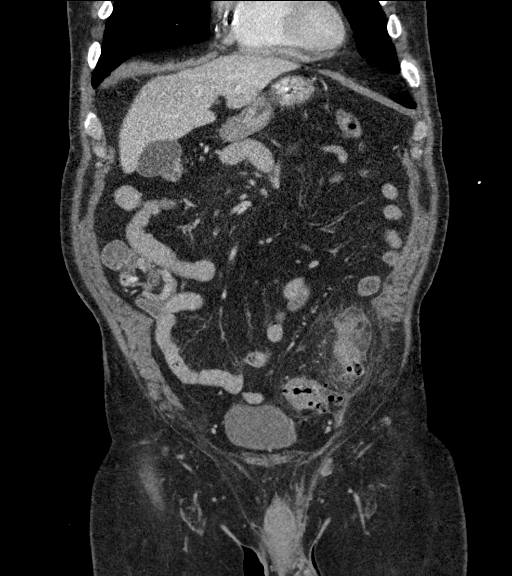
[im 69/155  soft-tissue]
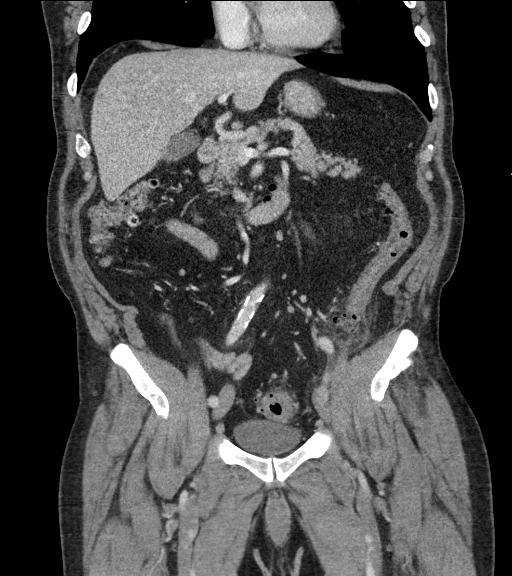
[im 86/155  soft-tissue]
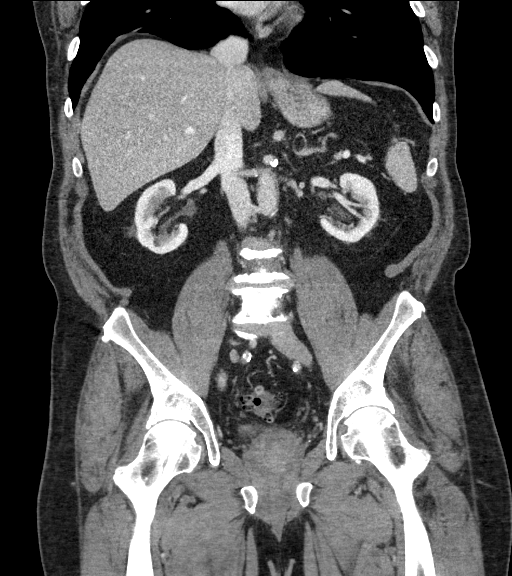

[16 of 46 positions shown; findings below may reference images not displayed]

FINDINGS: Lower chest: Visualized lung bases are clear.

Hepatobiliary: Liver demonstrates a normal contrast enhanced
appearance. Gallbladder within normal limits. No biliary dilatation.

Pancreas: Scattered calcifications within the pancreatic body,
likely related to previous/chronic pancreatitis. Pancreas otherwise
unremarkable without acute inflammatory changes.

Spleen: Spleen within normal limits.

Adrenals/Urinary Tract: Adrenal glands are normal. Kidneys equal in
size with symmetric enhancement. No nephrolithiasis, hydronephrosis,
or focal enhancing renal mass. Few subcentimeter hypodensities
within the right kidney too small the characterize. No hydroureter.
Partially distended bladder within normal limits.

Stomach/Bowel: Stomach within normal limits. No evidence for bowel
obstruction. Normal appendix. Prominent inflammatory changes seen
about multiple colonic diverticula within the left lower quadrant,
consistent with acute diverticulitis. No evidence for abscess. Few
tiny locules of air better not definitely position within the
colonic lumen suspicious for possible micro perforation (series 7,
image 152). No other acute inflammatory changes seen about the
bowels.

Vascular/Lymphatic: Extensive aorto bi-iliac atherosclerotic
disease. Mesenteric vessels patent proximally. No adenopathy.

Reproductive: Prostate enlarged measuring up to 5.9 cm in transverse
diameter.

Other: No free air or fluid. Small fat containing paraumbilical and
bilateral inguinal hernias noted.

Musculoskeletal: Chronic fatty atrophy noted within the posterior
paraspinous musculature. No acute osseous abnormality. No worrisome
lytic or blastic osseous lesions. Multilevel degenerative
spondylolysis, most notable at L4-5 and L5-S1.
IMPRESSION: 1. Findings consistent with acute sigmoid diverticulitis. Few small
locules of gas within this region suspicious for possible early
micro perforation. No evidence for abscess.
2. Extensive aortic atherosclerosis.
3. Enlarged prostate.

Critical Value/emergent results were called by telephone at the time
of interpretation on 12/02/2017 at [DATE] to Bamboom Bansal [place] , who
verbally acknowledged these results.

## 2020-06-11 ENCOUNTER — Other Ambulatory Visit (INDEPENDENT_AMBULATORY_CARE_PROVIDER_SITE_OTHER): Payer: Self-pay | Admitting: Vascular Surgery

## 2020-06-11 DIAGNOSIS — I6521 Occlusion and stenosis of right carotid artery: Secondary | ICD-10-CM

## 2020-06-12 ENCOUNTER — Other Ambulatory Visit: Payer: Self-pay

## 2020-06-12 ENCOUNTER — Encounter (INDEPENDENT_AMBULATORY_CARE_PROVIDER_SITE_OTHER): Payer: Self-pay | Admitting: Vascular Surgery

## 2020-06-12 ENCOUNTER — Ambulatory Visit (INDEPENDENT_AMBULATORY_CARE_PROVIDER_SITE_OTHER): Payer: Medicare HMO

## 2020-06-12 ENCOUNTER — Ambulatory Visit (INDEPENDENT_AMBULATORY_CARE_PROVIDER_SITE_OTHER): Payer: Medicare HMO | Admitting: Vascular Surgery

## 2020-06-12 VITALS — BP 110/71 | HR 60 | Resp 16 | Ht 70.0 in | Wt 226.0 lb

## 2020-06-12 DIAGNOSIS — I6521 Occlusion and stenosis of right carotid artery: Secondary | ICD-10-CM

## 2020-06-12 DIAGNOSIS — I10 Essential (primary) hypertension: Secondary | ICD-10-CM | POA: Diagnosis not present

## 2020-06-12 DIAGNOSIS — I6523 Occlusion and stenosis of bilateral carotid arteries: Secondary | ICD-10-CM

## 2020-06-12 DIAGNOSIS — E119 Type 2 diabetes mellitus without complications: Secondary | ICD-10-CM

## 2020-06-12 DIAGNOSIS — E785 Hyperlipidemia, unspecified: Secondary | ICD-10-CM

## 2020-06-12 NOTE — Assessment & Plan Note (Signed)
blood pressure control important in reducing the progression of atherosclerotic disease. On appropriate oral medications.  

## 2020-06-12 NOTE — Assessment & Plan Note (Signed)
Today he underwent a duplex today.  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  His velocities are just into the 40 to 59% range on the left although this may be falsely elevated due to compensatory flow. No role for intervention at that level.  Continue Plavix and statin agent.  I would recommend checking this at least annually at this point.

## 2020-06-12 NOTE — Assessment & Plan Note (Signed)
lipid control important in reducing the progression of atherosclerotic disease. Continue statin therapy  

## 2020-06-12 NOTE — Progress Notes (Signed)
Patient ID: Brendan Callahan, male   DOB: 04-15-1946, 75 y.o.   MRN: [id number]  Chief Complaint  Patient presents with  . Carotid  . Follow-up    ultrasound    HPI Brendan Callahan is a 75 y.o. male.  I am asked to see the patient by Dr. Doy Hutching for evaluation of carotid disease.  The patient reports knowing that his right carotid artery was completely blocked about a decade ago.  He underwent coronary artery bypass grafting it sounds like it was known at that time.  He is doing well without any focal neurologic symptoms currently. Specifically, the patient denies amaurosis fugax, speech or swallowing difficulties, or arm or leg weakness or numbness.  He has no previous history of stroke or TIA to his knowledge.  This had not been checked in a few years, so he had it checked with duplex today.  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  His velocities are just into the 40 to 59% range on the left although this may be falsely elevated due to compensatory flow.   Past Medical History:  Diagnosis Date  . Acute diverticulitis 12/02/2017  . GERD (gastroesophageal reflux disease)   . Gout    "on daily RX" (12/02/2017)  . High cholesterol   . History of kidney stones   . Hypertension   . Type II diabetes mellitus ([place])     Past Surgical History:  Procedure Laterality Date  . BLADDER STONE REMOVAL  ~ 2004  . CORONARY ARTERY BYPASS GRAFT  2001   CABG X3"  . TONSILLECTOMY      Family History  Problem Relation Age of Onset  . Hernia Mother   . Healthy Father   No bleeding or clotting disorders   Social History   Tobacco Use  . Smoking status: Former Smoker    Packs/day: 1.00    Years: 20.00    Pack years: 20.00    Types: Cigarettes  . Smokeless tobacco: Never Used  . Tobacco comment: 12/02/2017 "nothing since 1980s"  Vaping Use  . Vaping Use: Never used  Substance Use Topics  . Alcohol use: Yes    Comment: 12/02/2017 "couple beers/month"  . Drug use: Never     No Known Allergies  Current Outpatient Medications  Medication Sig Dispense Refill  . clopidogrel (PLAVIX) 75 MG tablet Take by mouth.    Marland Kitchen ibuprofen (ADVIL,MOTRIN) 800 MG tablet Take 1 tablet (800 mg total) by mouth 3 (three) times daily. 21 tablet 0  . Multiple Vitamin (MULTIVITAMIN WITH MINERALS) TABS tablet Take 1 tablet by mouth daily.    Marland Kitchen allopurinol (ZYLOPRIM) 300 MG tablet Take 300 mg by mouth daily.    Marland Kitchen aspirin 325 MG tablet Take 325 mg by mouth daily. (Patient not taking: Reported on 06/12/2020)    . atorvastatin (LIPITOR) 20 MG tablet Take 20 mg by mouth daily.    Marland Kitchen azithromycin (ZITHROMAX) 250 MG tablet Take 1 tablet (250 mg total) by mouth daily. Take first 2 tablets together, then 1 every day until finished. (Patient not taking: Reported on 06/12/2020) 6 tablet 0  . docusate sodium (COLACE) 100 MG capsule Take 1 capsule (100 mg total) by mouth 2 (two) times daily. (Patient not taking: Reported on 06/12/2020) 10 capsule 0  . gabapentin (NEURONTIN) 300 MG capsule Take 300 mg by mouth daily.    . metFORMIN (GLUCOPHAGE) 500 MG tablet Take 500 mg by mouth daily.    . metoprolol succinate (TOPROL-XL)  50 MG 24 hr tablet Take 50 mg by mouth daily.    . pantoprazole (PROTONIX) 20 MG tablet Take 20 mg by mouth daily.    . sildenafil (VIAGRA) 100 MG tablet Take 100 mg by mouth daily as needed for erectile dysfunction.    . vitamin B-12 (CYANOCOBALAMIN) 100 MCG tablet Take 100 mcg by mouth daily. (Patient not taking: Reported on 06/12/2020)     No current facility-administered medications for this visit.      REVIEW OF SYSTEMS (Negative unless checked)  Constitutional: [] Weight loss  [] Fever  [] Chills Cardiac: [] Chest pain   [] Chest pressure   [] Palpitations   [] Shortness of breath when laying flat   [] Shortness of breath at rest   [] Shortness of breath with exertion. Vascular:  [] Pain in legs with walking   [] Pain in legs at rest   [] Pain in legs when laying flat   [] Claudication    [] Pain in feet when walking  [] Pain in feet at rest  [] Pain in feet when laying flat   [] History of DVT   [] Phlebitis   [] Swelling in legs   [] Varicose veins   [] Non-healing ulcers Pulmonary:   [] Uses home oxygen   [] Productive cough   [] Hemoptysis   [] Wheeze  [] COPD   [] Asthma Neurologic:  [] Dizziness  [] Blackouts   [] Seizures   [] History of stroke   [] History of TIA  [] Aphasia   [] Temporary blindness   [] Dysphagia   [] Weakness or numbness in arms   [] Weakness or numbness in legs Musculoskeletal:  [x] Arthritis   [] Joint swelling   [x] Joint pain   [] Low back pain Hematologic:  [] Easy bruising  [] Easy bleeding   [] Hypercoagulable state   [] Anemic  [] Hepatitis Gastrointestinal:  [] Blood in stool   [] Vomiting blood  [] Gastroesophageal reflux/heartburn   [] Abdominal pain Genitourinary:  [] Chronic kidney disease   [] Difficult urination  [] Frequent urination  [] Burning with urination   [] Hematuria Skin:  [] Rashes   [] Ulcers   [] Wounds Psychological:  [] History of anxiety   []  History of major depression.    Physical Exam BP 110/71   Pulse 60   Resp 16   Ht 5\' 10"  (1.778 m)   Wt 226 lb (102.5 kg)   BMI 32.43 kg/m  Gen:  WD/WN, NAD. Appears younger than stated age Head: [place]/AT, No temporalis wasting. Prominent temp pulse not noted. Ear/Nose/Throat: Hearing grossly intact, nares w/o erythema or drainage, oropharynx w/o Erythema/Exudate Eyes: Conjunctiva clear, sclera non-icteric  Neck: trachea midline.  Trachea midline Pulmonary:  Good air movement, respirations not labored Cardiac: RRR, no JVD Vascular:  Vessel Right Left  Radial Palpable Palpable                   Gastrointestinal: soft, non-tender/non-distended. Musculoskeletal: M/S 5/5 throughout.  Extremities without ischemic changes.  No deformity or atrophy. No edema. Neurologic: Sensation grossly intact in extremities.  Symmetrical.  Speech is fluent. Motor exam as listed above. Psychiatric: Judgment intact, Mood & affect  appropriate for pt's clinical situation. Dermatologic: No rashes or ulcers noted.  No cellulitis or open wounds.    Radiology No results found.  Labs No results found for this or any previous visit (from the past 2160 hour(s)).  Assessment/Plan:  Carotid artery stenosis Today he underwent a duplex today.  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  His velocities are just into the 40 to 59% range on the left although this may be falsely elevated due to compensatory flow. No role for intervention at that  level.  Continue Plavix and statin agent.  I would recommend checking this at least annually at this point.  Benign essential hypertension blood pressure control important in reducing the progression of atherosclerotic disease. On appropriate oral medications.   DM (diabetes mellitus) ([place]) blood glucose control important in reducing the progression of atherosclerotic disease. Also, involved in wound healing. On appropriate medications.   Hyperlipidemia LDL goal <100 lipid control important in reducing the progression of atherosclerotic disease. Continue statin therapy       Leotis Pain 06/12/2020, 2:43 PM   This note was created with Dragon medical transcription system.  Any errors from dictation are unintentional.

## 2020-06-12 NOTE — Assessment & Plan Note (Signed)
blood glucose control important in reducing the progression of atherosclerotic disease. Also, involved in wound healing. On appropriate medications.  

## 2020-07-13 ENCOUNTER — Emergency Department: Payer: Medicare HMO

## 2020-07-13 ENCOUNTER — Observation Stay
Admission: EM | Admit: 2020-07-13 | Discharge: 2020-07-14 | Disposition: A | Discharge status: Home / Self Care | Payer: Medicare HMO | Attending: Family Medicine | Admitting: Family Medicine

## 2020-07-13 ENCOUNTER — Encounter: Payer: Self-pay | Admitting: Emergency Medicine

## 2020-07-13 ENCOUNTER — Other Ambulatory Visit: Payer: Self-pay

## 2020-07-13 DIAGNOSIS — Z87891 Personal history of nicotine dependence: Secondary | ICD-10-CM | POA: Diagnosis not present

## 2020-07-13 DIAGNOSIS — E1165 Type 2 diabetes mellitus with hyperglycemia: Secondary | ICD-10-CM | POA: Insufficient documentation

## 2020-07-13 DIAGNOSIS — E119 Type 2 diabetes mellitus without complications: Secondary | ICD-10-CM | POA: Diagnosis not present

## 2020-07-13 DIAGNOSIS — Z20822 Contact with and (suspected) exposure to covid-19: Secondary | ICD-10-CM | POA: Diagnosis not present

## 2020-07-13 DIAGNOSIS — Z7984 Long term (current) use of oral hypoglycemic drugs: Secondary | ICD-10-CM | POA: Diagnosis not present

## 2020-07-13 DIAGNOSIS — F4489 Other dissociative and conversion disorders: Secondary | ICD-10-CM

## 2020-07-13 DIAGNOSIS — G9341 Metabolic encephalopathy: Principal | ICD-10-CM | POA: Insufficient documentation

## 2020-07-13 DIAGNOSIS — I2510 Atherosclerotic heart disease of native coronary artery without angina pectoris: Secondary | ICD-10-CM | POA: Insufficient documentation

## 2020-07-13 DIAGNOSIS — I6529 Occlusion and stenosis of unspecified carotid artery: Secondary | ICD-10-CM | POA: Diagnosis not present

## 2020-07-13 DIAGNOSIS — R4182 Altered mental status, unspecified: Secondary | ICD-10-CM

## 2020-07-13 DIAGNOSIS — I152 Hypertension secondary to endocrine disorders: Secondary | ICD-10-CM | POA: Diagnosis present

## 2020-07-13 DIAGNOSIS — E1169 Type 2 diabetes mellitus with other specified complication: Secondary | ICD-10-CM | POA: Diagnosis present

## 2020-07-13 DIAGNOSIS — Z79899 Other long term (current) drug therapy: Secondary | ICD-10-CM | POA: Insufficient documentation

## 2020-07-13 DIAGNOSIS — I10 Essential (primary) hypertension: Secondary | ICD-10-CM | POA: Diagnosis not present

## 2020-07-13 DIAGNOSIS — R778 Other specified abnormalities of plasma proteins: Secondary | ICD-10-CM

## 2020-07-13 DIAGNOSIS — E118 Type 2 diabetes mellitus with unspecified complications: Secondary | ICD-10-CM | POA: Diagnosis present

## 2020-07-13 DIAGNOSIS — G459 Transient cerebral ischemic attack, unspecified: Secondary | ICD-10-CM

## 2020-07-13 LAB — URINALYSIS, COMPLETE (UACMP) WITH MICROSCOPIC
Bacteria, UA: NONE SEEN
Bilirubin Urine: NEGATIVE
Glucose, UA: NEGATIVE mg/dL
Hgb urine dipstick: NEGATIVE
Ketones, ur: NEGATIVE mg/dL
Leukocytes,Ua: NEGATIVE
Nitrite: NEGATIVE
Protein, ur: NEGATIVE mg/dL
Specific Gravity, Urine: 1.003 — ABNORMAL LOW (ref 1.005–1.030)
WBC, UA: NONE SEEN WBC/hpf (ref 0–5)
pH: 5 (ref 5.0–8.0)

## 2020-07-13 LAB — CBC
HCT: 44.3 % (ref 39.0–52.0)
Hemoglobin: 15.2 g/dL (ref 13.0–17.0)
MCH: 30.2 pg (ref 26.0–34.0)
MCHC: 34.3 g/dL (ref 30.0–36.0)
MCV: 88.1 fL (ref 80.0–100.0)
Platelets: 189 10*3/uL (ref 150–400)
RBC: 5.03 MIL/uL (ref 4.22–5.81)
RDW: 14.6 % (ref 11.5–15.5)
WBC: 8 10*3/uL (ref 4.0–10.5)
nRBC: 0 % (ref 0.0–0.2)

## 2020-07-13 LAB — COMPREHENSIVE METABOLIC PANEL
ALT: 23 U/L (ref 0–44)
AST: 32 U/L (ref 15–41)
Albumin: 4.1 g/dL (ref 3.5–5.0)
Alkaline Phosphatase: 96 U/L (ref 38–126)
Anion gap: 10 (ref 5–15)
BUN: 20 mg/dL (ref 8–23)
CO2: 21 mmol/L — ABNORMAL LOW (ref 22–32)
Calcium: 8.7 mg/dL — ABNORMAL LOW (ref 8.9–10.3)
Chloride: 106 mmol/L (ref 98–111)
Creatinine, Ser: 1.17 mg/dL (ref 0.61–1.24)
GFR, Estimated: >60 mL/min (ref >60)
Glucose, Bld: 122 mg/dL — ABNORMAL HIGH (ref 70–99)
Potassium: 3.8 mmol/L (ref 3.5–5.1)
Sodium: 137 mmol/L (ref 135–145)
Total Bilirubin: 0.6 mg/dL (ref 0.3–1.2)
Total Protein: 7.6 g/dL (ref 6.5–8.1)

## 2020-07-13 LAB — CBG MONITORING, ED: Glucose-Capillary: 141 mg/dL — ABNORMAL HIGH (ref 70–99)

## 2020-07-13 LAB — TROPONIN I (HIGH SENSITIVITY)
Troponin I (High Sensitivity): 47 ng/L — ABNORMAL HIGH (ref <18)
Troponin I (High Sensitivity): 54 ng/L — ABNORMAL HIGH (ref <18)

## 2020-07-13 NOTE — ED Triage Notes (Signed)
Pt unable to address reason to for visit to  ER, RN called Kaleen Odea [phone] who proceeded to ask RN to talk to Pricilla Loveless, who reports pt called her about 6 pm asking about a new washer and drying machine in his apartment. Per pt's step-daughter pt is alert, oriented independent. Pt is alert to self only. Reports he does not keep up with dates, months or years. Pt's step-daughter with pt

## 2020-07-14 ENCOUNTER — Observation Stay
Admit: 2020-07-14 | Discharge: 2020-07-14 | Disposition: A | Discharge status: Home / Self Care | Payer: Medicare HMO | Attending: Internal Medicine | Admitting: Internal Medicine

## 2020-07-14 ENCOUNTER — Other Ambulatory Visit: Payer: Self-pay

## 2020-07-14 ENCOUNTER — Observation Stay: Payer: Medicare HMO

## 2020-07-14 DIAGNOSIS — G459 Transient cerebral ischemic attack, unspecified: Secondary | ICD-10-CM

## 2020-07-14 DIAGNOSIS — G9341 Metabolic encephalopathy: Secondary | ICD-10-CM

## 2020-07-14 LAB — LIPID PANEL
Cholesterol: 140 mg/dL (ref 0–200)
HDL: 41 mg/dL (ref >40)
LDL Cholesterol: 76 mg/dL (ref 0–99)
Total CHOL/HDL Ratio: 3.4 RATIO
Triglycerides: 116 mg/dL (ref <150)
VLDL: 23 mg/dL (ref 0–40)

## 2020-07-14 LAB — HEMOGLOBIN A1C
Hgb A1c MFr Bld: 6.5 % — ABNORMAL HIGH (ref 4.8–5.6)
Mean Plasma Glucose: 139.85 mg/dL

## 2020-07-14 LAB — ECHOCARDIOGRAM COMPLETE
Area-P 1/2: 2.36 cm2
Height: 71 in
S' Lateral: 2.74 cm
Weight: 3440 oz

## 2020-07-14 LAB — SARS CORONAVIRUS 2 (TAT 6-24 HRS): SARS Coronavirus 2: NEGATIVE

## 2020-07-14 LAB — CBG MONITORING, ED
Glucose-Capillary: 106 mg/dL — ABNORMAL HIGH (ref 70–99)
Glucose-Capillary: 101 mg/dL — ABNORMAL HIGH (ref 70–99)

## 2020-07-14 LAB — TROPONIN I (HIGH SENSITIVITY): Troponin I (High Sensitivity): 48 ng/L — ABNORMAL HIGH (ref <18)

## 2020-07-14 MED ORDER — PERFLUTREN LIPID MICROSPHERE
1.0000 mL | INTRAVENOUS | Status: AC | PRN
  Administered 2020-07-14: 4 mL via INTRAVENOUS
  Filled 2020-07-14: qty 10

## 2020-07-14 MED ORDER — ENOXAPARIN SODIUM 40 MG/0.4ML ~~LOC~~ SOLN: 40.0000 mg | SUBCUTANEOUS | Status: DC

## 2020-07-14 MED ORDER — INSULIN ASPART 100 UNIT/ML ~~LOC~~ SOLN: 0.0000 [IU] | Freq: Three times a day (TID) | SUBCUTANEOUS | Status: DC

## 2020-07-14 MED ORDER — METOPROLOL TARTRATE 50 MG PO TABS
50.0000 mg | ORAL_TABLET | Freq: Once | ORAL | Status: AC
  Administered 2020-07-14: 50 mg via ORAL
  Filled 2020-07-14: qty 1

## 2020-07-14 MED ORDER — INSULIN ASPART 100 UNIT/ML ~~LOC~~ SOLN: 0.0000 [IU] | Freq: Every day | SUBCUTANEOUS | Status: DC

## 2020-07-14 MED ORDER — ACETAMINOPHEN 650 MG RE SUPP: 650.0000 mg | RECTAL | Status: DC | PRN

## 2020-07-14 MED ORDER — ACETAMINOPHEN 325 MG PO TABS: 650.0000 mg | ORAL_TABLET | ORAL | Status: DC | PRN

## 2020-07-14 MED ORDER — ACETAMINOPHEN 160 MG/5ML PO SOLN
650.0000 mg | ORAL | Status: DC | PRN
  Filled 2020-07-14: qty 20.3

## 2020-07-14 MED ORDER — STROKE: EARLY STAGES OF RECOVERY BOOK: Freq: Once | Status: DC

## 2020-07-14 NOTE — Progress Notes (Signed)
SLP Cancellation Note  Patient Details Name: Brendan Callahan MRN: [id number] DOB: 04/09/1946   Cancelled treatment:       Reason Eval/Treat Not Completed: SLP screened, no needs identified, will sign off (chart reviewed; consulted MD then met w/ pt in room) Pt denied any difficulty swallowing and is currently on a regular diet; tolerates swallowing pills w/ water per NSG. Pt conversed at conversational level w/out deficits noted; pt and MD denied any speech-language deficits during their conversation. Pt described his living situation and his independence w/ ADLs at home. Discussed pt's s/s prior to admit; he stated he did not remember speaking in such a way and stated he often "asks questions a second time" w/ his Daughter. Noted bilateral carotid artery stenosis in his H&P to which he stated his primary MD said was "fine". Encouraged pt to continue f/u w/ PCP/MDs.  No further skilled ST services indicated as pt appears at his baseline w/ speech and language skills today. He denied any current need for f/u. NSG to reconsult if any change in status while admitted. Encouraged pt to f/u w/ PCP if any new changes noted in his communication skills during ADLs once returned home.       Katherine Watson, MS, CCC-SLP Speech Language Pathologist [facility] [phone] Watson,Katherine 07/14/2020, 10:33 AM   

## 2020-07-14 NOTE — Consult Note (Signed)
[facility] Cardiology  CARDIOLOGY CONSULT NOTE  Patient ID: Brendan Callahan MRN: [id number] DOB/AGE: 09-02-1945 75 y.o.  Admit date: 07/13/2020 Referring Physician Dwyane Dee Primary Physician [facility] Primary Cardiologist Nehemiah Massed Reason for Consultation elevated troponin  HPI: 75 year old gentleman referred for evaluation of elevated troponin.  The patient was admitted with altered mental status and confusion.  Admission labs were unremarkable with the exception of borderline elevated troponin of 47, 54 and 48.  The patient denies chest pain or shortness of breath.  ECG revealed sinus rhythm at 68 bpm without ischemic ST-T wave changes.  The patient does have cerebrovascular disease with known occluded LCA and 40 to 59% stenosis RCA, followed by Dr. Lucky Cowboy.  CT scan was unremarkable.  The patient is now lucid, oriented x3, back to baseline.  He denies chest pain or shortness of breath.  Has known coronary artery disease, status post CABG x2 with LIMA to LAD, and radial artery to RCA in [place] in 2001.  He is status post stent ostium left circumflex [place] in 2011, followed by Dr. Nehemiah Massed.  Post recent ETT Myoview 02/23/2019 revealed normal left ventricular function, without evidence for scar or ischemia.  Review of systems complete and found to be negative unless listed above     Past Medical History:  Diagnosis Date  . Acute diverticulitis 12/02/2017  . GERD (gastroesophageal reflux disease)   . Gout    "on daily RX" (12/02/2017)  . High cholesterol   . History of kidney stones   . Hypertension   . Type II diabetes mellitus ([place])     Past Surgical History:  Procedure Laterality Date  . BLADDER STONE REMOVAL  ~ 2004  . CORONARY ARTERY BYPASS GRAFT  2001   CABG X3"  . TONSILLECTOMY      (Not in a hospital admission)  Social History   Socioeconomic History  . Marital status: Divorced    Spouse name: Not on file  . Number of children: Not on file  . Years of education: Not on file  .  Highest education level: Not on file  Occupational History  . Not on file  Tobacco Use  . Smoking status: Former Smoker    Packs/day: 1.00    Years: 20.00    Pack years: 20.00    Types: Cigarettes  . Smokeless tobacco: Never Used  . Tobacco comment: 12/02/2017 "nothing since 1980s"  Vaping Use  . Vaping Use: Never used  Substance and Sexual Activity  . Alcohol use: Yes    Comment: 12/02/2017 "couple beers/month"  . Drug use: Never  . Sexual activity: Not Currently  Other Topics Concern  . Not on file  Social History Narrative  . Not on file   Social Determinants of Health   Financial Resource Strain: Not on file  Food Insecurity: Not on file  Transportation Needs: Not on file  Physical Activity: Not on file  Stress: Not on file  Social Connections: Not on file  Intimate Partner Violence: Not on file    Family History  Problem Relation Age of Onset  . Hernia Mother   . Healthy Father       Review of systems complete and found to be negative unless listed above      PHYSICAL EXAM  General: Well developed, well nourished, in no acute distress HEENT:  Normocephalic and atramatic Neck:  No JVD.  Lungs: Clear bilaterally to auscultation and percussion. Heart: HRRR . Normal S1 and S2 without gallops or murmurs.  Abdomen: Bowel sounds  are positive, abdomen soft and non-tender  Msk:  Back normal, normal gait. Normal strength and tone for age. Extremities: No clubbing, cyanosis or edema.   Neuro: Alert and oriented X 3. Psych:  Good affect, responds appropriately  Labs:   Lab Results  Component Value Date   WBC 8.0 07/13/2020   HGB 15.2 07/13/2020   HCT 44.3 07/13/2020   MCV 88.1 07/13/2020   PLT 189 07/13/2020    Recent Labs  Lab 07/13/20 2027  NA 137  K 3.8  CL 106  CO2 21*  BUN 20  CREATININE 1.17  CALCIUM 8.7*  PROT 7.6  BILITOT 0.6  ALKPHOS 96  ALT 23  AST 32  GLUCOSE 122*   No results found for: CKTOTAL, CKMB, CKMBINDEX, TROPONINI  Lab  Results  Component Value Date   CHOL 140 07/14/2020   Lab Results  Component Value Date   HDL 41 07/14/2020   Lab Results  Component Value Date   LDLCALC 76 07/14/2020   Lab Results  Component Value Date   TRIG 116 07/14/2020   Lab Results  Component Value Date   CHOLHDL 3.4 07/14/2020   No results found for: LDLDIRECT    Radiology: CT Head Wo Contrast  Result Date: 07/13/2020 CLINICAL DATA:  Delirium. EXAM: CT HEAD WITHOUT CONTRAST TECHNIQUE: Contiguous axial images were obtained from the base of the skull through the vertex without intravenous contrast. COMPARISON:  None. FINDINGS: Brain: Remote infarct involving the basal ganglia with extension to the overlying corona radiata. There is adjacent ex vacuo dilation of the frontal horn of the right lateral ventricle. No evidence of acute large vascular territory infarct. Patchy white matter hypoattenuation, most likely related to chronic microvascular ischemic disease. No acute hemorrhage. No hydrocephalus. No mass lesion or abnormal mass effect. Vascular: Calcific atherosclerosis. No hyperdense vessel identified. Skull: No acute fracture. Sinuses/Orbits: Opacified small right maxillary sinus with associated wall thickening. Scattered ethmoid air cell opacification. Other: No mastoid effusions. IMPRESSION: 1. No evidence of acute intracranial abnormality. 2. 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 3. Paranasal sinus disease, including likely chronic right maxillary sinus disease. Electronically Signed   By: Margaretha Sheffield MD   On: 07/13/2020 21:16   MR BRAIN WO CONTRAST  Result Date: 07/14/2020 CLINICAL DATA:  Mental status changes of unknown cause.  Confusion. EXAM: MRI HEAD WITHOUT CONTRAST TECHNIQUE: Multiplanar, multiecho pulse sequences of the brain and surrounding structures were obtained without intravenous contrast. COMPARISON:  Head CT  yesterday. FINDINGS: Brain: Diffusion imaging does not show any acute or subacute infarction. No focal abnormality affects the brainstem or cerebellum. Cerebral hemispheres show old infarction in the right basal ganglia and moderate chronic small-vessel ischemic changes of the deep and subcortical white matter. No cortical or large vessel territory infarction. No mass lesion, hemorrhage, hydrocephalus or extra-axial collection. Some old hemosiderin deposition in the region of the right basal ganglia stroke. Vascular: Abnormal flow in the right internal carotid artery. Other major vessels show flow. Skull and upper cervical spine: Normal Sinuses/Orbits: Clear except for a chronic opacified contracted right maxillary sinus. Other: None IMPRESSION: 1. No acute finding by MRI. 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 Moderate chronic small-vessel ischemic changes of the cerebral hemispheric white matter. 2. Abnormal flow in the right internal carotid artery. This is consistent with occlusion or slow flow. 3. Chronic opacified contracted right maxillary sinus. Electronically Signed   By: Jan Fireman.D.  On: 07/14/2020 03:30   DG Chest Port 1 View  Result Date: 07/14/2020 CLINICAL DATA:  Confusion.  Unsure white came to the ER. EXAM: PORTABLE CHEST 1 VIEW COMPARISON:  Chest x-ray 05/04/2018 FINDINGS: Sternotomy wires and cardiac surgical changes again noted overlying the mediastinum. The heart size and mediastinal contours are within normal limits. Aortic arch calcifications. No focal consolidation. No pulmonary edema. No pleural effusion. No pneumothorax. No acute osseous abnormality. IMPRESSION: 1. No acute cardiopulmonary abnormality. 2.  Aortic Atherosclerosis (ICD10-I70.0). Electronically Signed   By: Tish Frederickson M.D.   On: 07/14/2020 02:43    EKG: Sinus rhythm at 68 bpm with nonspecific T wave abnormalities  ASSESSMENT AND PLAN:   1.  Borderline elevated high-sensitivity troponin (47, 54, 48 ),  in the absence of chest pain or shortness of breath, with nondiagnostic ECG, likely not due to acute coronary syndrome, most consistent with myocardial injury in the absence of ischemia ( I5A) 2.  Altered mental status, TIA versus metabolic encephalopathy, patient appears back to baseline 3.  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 Wyn Quaker, CT scan negative 4.  CAD, status post CABG x2, stent ostium of the circumflex, currently without chest pain, with negative ETT Myoview 02/23/2019, followed by Dr. Gwen Pounds 5.  Essential hypertension, blood pressure well controlled on home BP medication  Recommendations  1.  Agree with current therapy 2.  Defer full dose anticoagulation 3.  Defer cardiac catheterization in the absence of chest pain, in the setting of altered mental status and possible TIA 4.  Continue clopidogrel 5.  Continue atorvastatin 6.  Continue home BP medications 7.  Review 2D echocardiogram 8.  Follow-up with Dr. Gwen Pounds following discharge  Signed: Marcina Millard MD,PhD, [facility] 07/14/2020, 2:16 PM

## 2020-07-14 NOTE — ED Notes (Signed)
Pt daughter at bedside. Updated pt's daughter with plan of care. Daughter verbalized understanding at this time. Pt requesting to removed monitor cords.

## 2020-07-14 NOTE — ED Notes (Signed)
Pt ambulatory with a steady gait. Pt is alert and orientedx4. Pt's daughter walking with pt out

## 2020-07-14 NOTE — Consult Note (Signed)
Neurology Consultation Reason for Consult: Confusion Referring Physician: Madaline Brilliant  CC: Confusion  History is obtained from: Patient, stepdaughter  HPI: Brendan Callahan is a 75 y.o. male with a history of hyperlipidemia, diabetes, hypertension who presents with acute memory deficit. He called his stepdaughter yesterday asking if she had installed a new washer/dryer in his house.  She then went over to his house because this seemed like a very odd question to ask, and found him to have significant problems with his memory.  He did not remember calling her, but was sitting on the couch watching TV.  He then began asking her the same questions over and over again.  He did not remember having purchased a car within the past few weeks, and therefore was very confused why he had two car keys instead of just the one he was supposed to have.  She states that she put some things in a bag and brought him into the emergency department, and he asked her every few minutes what was in the bag.  This lasted until approximately 2 AM at which point he began to remember things, but he does not have memory of the events of yesterday.  He does, however have memory of buying his car and washer/dryer and everything else prior to the episode.  She states that he had a clear sensorium throughout the event, just did not form new memories.  He never lost consciousness or had any other concerning symptoms.   ROS: A 14 point ROS was performed and is negative except as noted in the HPI.  Past Medical History:  Diagnosis Date  . Acute diverticulitis 12/02/2017  . GERD (gastroesophageal reflux disease)   . Gout    "on daily RX" (12/02/2017)  . High cholesterol   . History of kidney stones   . Hypertension   . Type II diabetes mellitus (HCC)      Family History  Problem Relation Age of Onset  . Hernia Mother   . Healthy Father      Social History:  reports that he has quit smoking. His smoking use included  cigarettes. He has a 20.00 pack-year smoking history. He has never used smokeless tobacco. He reports current alcohol use. He reports that he does not use drugs.   Exam: Current vital signs: BP (!) 142/93   Pulse 60   Temp 98.4 F (36.9 C) (Oral)   Resp 14   Ht 5\' 11"  (1.803 m)   Wt 97.5 kg   SpO2 95%   BMI 29.99 kg/m  Vital signs in last 24 hours: Temp:  [98.4 F (36.9 C)] 98.4 F (36.9 C) (02/04 2020) Pulse Rate:  [56-90] 60 (02/05 1151) Resp:  [14-21] 14 (02/05 1151) BP: (126-204)/(77-156) 142/93 (02/05 1151) SpO2:  [90 %-100 %] 95 % (02/05 1151) Weight:  [97.5 kg] 97.5 kg (02/04 2021)   Physical Exam  Constitutional: Appears well-developed and well-nourished.  Psych: Affect appropriate to situation Eyes: No scleral injection HENT: No OP obstruction MSK: no joint deformities.  Cardiovascular: Normal rate and regular rhythm.  Respiratory: Effort normal, non-labored breathing GI: Soft.  No distension. There is no tenderness.  Skin: WDI  Neuro: Mental Status: Patient is awake, alert, oriented to person, place, month, year, and situation. Patient is able to give a clear and coherent history. No signs of aphasia or neglect Cranial Nerves: II: Visual Fields are full. Pupils are equal, round, and reactive to light.   III,IV, VI: EOMI without ptosis or diploplia.  V: Facial sensation is symmetric to temperature VII: Facial movement is symmetric.  VIII: hearing is intact to voice X: Uvula elevates symmetrically XI: Shoulder shrug is symmetric. XII: tongue is midline without atrophy or fasciculations.  Motor: Tone is normal. Bulk is normal. 5/5 strength was present in all four extremities.  Sensory: Sensation is symmetric to light touch and temperature in the arms and legs. Cerebellar: FNF  intact bilaterally   I have reviewed labs in epic and the results pertinent to this consultation are: Creatinine 1.17, sodium 137, calcium 8.7  I have reviewed the images  obtained: MRI brain-negative acute, he has a chronically occluded left carotid  Impression: 75 year old male with acute anterograde amnesia as well as some more selective retrograde amnesia during the event with return of his retrograde memory after the event.  This is very characteristic for transient global amnesia, and with negative scan I do not think that further testing is needed.  Given the duration and the clear sensorium other than memory difficulty, I think that seizure is extremely unlikely.  I would not favor any further work-up unless the patient develops further symptoms in the future.  Recommendations: 1) no further recommendations at this time, please call if any further questions or concerns remain.   Roland Rack, MD [facility] [phone]  If 7pm- 7am, please page neurology on call as listed in [place].

## 2020-07-14 NOTE — ED Notes (Signed)
Dr. Dwyane Dee down to d/c IVC at this time.

## 2020-07-14 NOTE — ED Notes (Signed)
Lunch meal tray given.  

## 2020-07-14 NOTE — Evaluation (Signed)
Occupational Therapy Evaluation Patient Details Name: Brendan Callahan MRN: [id number] DOB: 11/23/1945 Today's Date: 07/14/2020    History of Present Illness Pt is a 75 y/o M with PMH: CAD status post CABG, with negative stress test in 2021, status post recent preoperative clearance by his cardiologist as well as history of PAD, bilateral carotid artery stenosis, DM and HTN. Pt presented to ED with some confusion including forgetting the purchase of a new wsher and dryer and car which was concerning family. Pt presents this date on evaluation, alert and oriented.   Clinical Impression   Pt seen for OT evaluation this date in setting of acute presentation to ED with family concern for patient confusion. At time of therapy evaluation, pt is alert and oriented and appropriate. Pt demos normal ROM in all extremities and appropriate, if not slightly better strength in trunk and extremities given his age. OT educates pt re: role of OT in acute setting and pt with good understanding. Pt demos ability to perform all ADLs/ADL mobility independently with no AD and no cueing at this time. He is perhaps slightly impulsive, but this seems primarily related to no mobility concerns including no significant fall history. Pt reports he lives alone in an apartment with level entry, uses no AD at baseline, and still does some work in plumbing. Will complete OT order at this time as no acute needs are detected nor is there a suspected need for f/u OT services outside of acute stay.    Follow Up Recommendations  No OT follow up    Equipment Recommendations  None recommended by OT    Recommendations for Other Services       Precautions / Restrictions Precautions Precautions: Fall Restrictions Weight Bearing Restrictions: No      Mobility Bed Mobility Overal bed mobility: Independent                  Transfers Overall transfer level: Independent                    Balance Overall balance  assessment: Independent                                         ADL either performed or assessed with clinical judgement   ADL Overall ADL's : Modified independent;At baseline                                             Vision Baseline Vision/History: Wears glasses Patient Visual Report: No change from baseline       Perception     Praxis      Pertinent Vitals/Pain Pain Assessment: No/denies pain     Hand Dominance Right   Extremity/Trunk Assessment Upper Extremity Assessment Upper Extremity Assessment: Overall WFL for tasks assessed   Lower Extremity Assessment Lower Extremity Assessment: Overall WFL for tasks assessed   Cervical / Trunk Assessment Cervical / Trunk Assessment: Normal   Communication Communication Communication: No difficulties   Cognition Arousal/Alertness: Awake/alert Behavior During Therapy: WFL for tasks assessed/performed Overall Cognitive Status: Within Functional Limits for tasks assessed  General Comments       Exercises Other Exercises Other Exercises: Education re: role of OT in acute setting. Pt with good understanding and confirms both verbally and with demo that he is able to safely perform all ADLs/ADL mobility with no AD.   Shoulder Instructions      Home Living Family/patient expects to be discharged to:: Private residence Living Arrangements: Alone Available Help at Discharge: Family;Available PRN/intermittently (dtr is CNA at [facility]) Type of Home: Apartment Home Access: Level entry     Home Layout: One level               Home Equipment: None          Prior Functioning/Environment Level of Independence: Independent        Comments: driving, shopping, all I/ADLs I'ly        OT Problem List: Decreased safety awareness      OT Treatment/Interventions:      OT Goals(Current goals can be found in the care plan  section) Acute Rehab OT Goals Patient Stated Goal: "I have a lot to do today and really need to get home" OT Goal Formulation: All assessment and education complete, DC therapy  OT Frequency:     Barriers to D/C:            Co-evaluation              AM-PAC OT "6 Clicks" Daily Activity     Outcome Measure Help from another person eating meals?: None Help from another person taking care of personal grooming?: None Help from another person toileting, which includes using toliet, bedpan, or urinal?: None Help from another person bathing (including washing, rinsing, drying)?: None Help from another person to put on and taking off regular upper body clothing?: None Help from another person to put on and taking off regular lower body clothing?: None 6 Click Score: 24   End of Session    Activity Tolerance: Patient tolerated treatment well Patient left: Other (comment) (sitting edge of stretcher with feet planted on floor, Normal sitting balance)  OT Visit Diagnosis: Other symptoms and signs involving cognitive function                Time: 4680-3212 OT Time Calculation (min): 23 min Charges:  OT General Charges $OT Visit: 1 Visit OT Evaluation $OT Eval Low Complexity: 1 Low OT Treatments $Self Care/Home Management : 8-22 mins  Gerrianne Scale, MS, OTR/L ascom [phone] 07/14/20, 9:34 AM

## 2020-07-14 NOTE — ED Notes (Signed)
Pt returned to room from MRI att

## 2020-07-14 NOTE — Discharge Summary (Signed)
Physician Discharge Summary  Brendan Callahan [id number] DOB: 1945-08-19 DOA: 07/13/2020  PCP: System, Provider Not In  Admit date: 07/13/2020 .  Discharge date: 07/14/2020  Admitted From:  Home.  Disposition:   Home  Recommendations for Outpatient Follow-up:  1. Follow up with PCP in 1-2 weeks 2. Please obtain BMP/CBC in one week. 3. Advised to follow-up with primary care physician in 1 week.   4. Advised to follow-up with Dr. Nehemiah Massed in 1 week appointment has been made.  Home Health: None Equipment/Devices: None  Discharge Condition: Stable CODE STATUS:Full code Diet recommendation: Heart Healthy  Brief [facility] Course: This 75 years old male with PMH significant for CAD s/p CABG with negative stress test in 2021, s/p recent preoperative clearance by his cardiologist as well as history of PAD, bilateral carotid artery stenosis with chronic complete occlusion on the left and 40 to 59% occlusion on the right per recent carotid duplex by Dr.Dew  on January 22 as well as a history of DM and hypertension who lives alone who was brought in the emergency room due to concerns about altered mental status.  Most of history is provided by his stepdaughter who states usually he is mentally fine and he speaks to them daily however on the evening of presentation he appeared confused about recent events and kept repeating the same question over and over.  He was not recently ill,  He has had no other symptoms.  Neurological examination was non focal,  Patient was alert and oriented x1 on arrival,  All the work-up has been negative.  CT head without acute intracranial findings.  MRI is negative for acute stroke.  Echocardiogram was completed LVEF 60 to 65%,  no regional wall motion abnormalities.  Neurology was consulted,  stateS patient has transient global amnesia and he is back to his baseline mental status,  No further work-up needed.  Patient can be discharged from neurology perspective.   Patient has slightly elevated troponins,  Cardiology was consulted recommended in the absence of chest pain and EKG changes , slightly elevated troponin could not be related to ACS.  No further work-up needed Patient can be discharged from cardiology perspective.  Patient already has appointment Dr. Nehemiah Massed in 2 weeks.  Patient is being discharged home.   Discharge Diagnoses:  Principal Problem:   Acute metabolic encephalopathy Active Problems:   CAD (coronary artery disease)   Benign essential hypertension   Carotid artery stenosis   Hyperlipidemia associated with type 2 diabetes mellitus (HCC)   Hypertension associated with diabetes ([place])   Type II diabetes mellitus with complication (HCC)   TIA (transient ischemic attack)    Discharge Instructions  Discharge Instructions    Call MD for:  difficulty breathing, headache or visual disturbances   Complete by: As directed    Call MD for:  persistant dizziness or light-headedness   Complete by: As directed    Call MD for:  persistant nausea and vomiting   Complete by: As directed    Diet - low sodium heart healthy   Complete by: As directed    Diet Carb Modified   Complete by: As directed    Discharge instructions   Complete by: As directed    Advised to follow-up with primary care physician in 1 week.   Advised to follow-up with Dr. Nehemiah Massed in 1 week appointment has been made.   Increase activity slowly   Complete by: As directed      Allergies as of 07/14/2020  No Known Allergies     Medication List    TAKE these medications   allopurinol 300 MG tablet Commonly known as: ZYLOPRIM Take 300 mg by mouth daily.   atorvastatin 20 MG tablet Commonly known as: LIPITOR Take 20 mg by mouth daily.   clopidogrel 75 MG tablet Commonly known as: PLAVIX Take 75 mg by mouth daily.   gabapentin 300 MG capsule Commonly known as: NEURONTIN Take 300 mg by mouth daily.   metFORMIN 500 MG tablet Commonly known as:  GLUCOPHAGE Take 500 mg by mouth daily.   metoprolol succinate 50 MG 24 hr tablet Commonly known as: TOPROL-XL Take 50 mg by mouth daily.   multivitamin with minerals Tabs tablet Take 1 tablet by mouth daily.   pantoprazole 20 MG tablet Commonly known as: PROTONIX Take 20 mg by mouth daily.       Follow-up Information    [name], MD Follow up in 1 week(s).   Specialty: Cardiology Contact information: [address] [place] [address] [phone]              No Known Allergies  Consultations:  Cardiology  Neurology   Procedures/Studies: CT Head Wo Contrast  Result Date: 07/13/2020 CLINICAL DATA:  Delirium. EXAM: CT HEAD WITHOUT CONTRAST TECHNIQUE: Contiguous axial images were obtained from the base of the skull through the vertex without intravenous contrast. COMPARISON:  None. FINDINGS: Brain: Remote infarct involving the basal ganglia with extension to the overlying corona radiata. There is adjacent ex vacuo dilation of the frontal horn of the right lateral ventricle. No evidence of acute large vascular territory infarct. Patchy white matter hypoattenuation, most likely related to chronic microvascular ischemic disease. No acute hemorrhage. No hydrocephalus. No mass lesion or abnormal mass effect. Vascular: Calcific atherosclerosis. No hyperdense vessel identified. Skull: No acute fracture. Sinuses/Orbits: Opacified small right maxillary sinus with associated wall thickening. Scattered ethmoid air cell opacification. Other: No mastoid effusions. IMPRESSION: 1. No evidence of acute intracranial abnormality. 2. 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 3. Paranasal sinus disease, including likely chronic right maxillary sinus disease. Electronically Signed   By: Margaretha Sheffield MD   On: 07/13/2020 21:16   MR BRAIN WO CONTRAST  Result  Date: 07/14/2020 CLINICAL DATA:  Mental status changes of unknown cause.  Confusion. EXAM: MRI HEAD WITHOUT CONTRAST TECHNIQUE: Multiplanar, multiecho pulse sequences of the brain and surrounding structures were obtained without intravenous contrast. COMPARISON:  Head CT yesterday. FINDINGS: Brain: Diffusion imaging does not show any acute or subacute infarction. No focal abnormality affects the brainstem or cerebellum. Cerebral hemispheres show old infarction in the right basal ganglia and moderate chronic small-vessel ischemic changes of the deep and subcortical white matter. No cortical or large vessel territory infarction. No mass lesion, hemorrhage, hydrocephalus or extra-axial collection. Some old hemosiderin deposition in the region of the right basal ganglia stroke. Vascular: Abnormal flow in the right internal carotid artery. Other major vessels show flow. Skull and upper cervical spine: Normal Sinuses/Orbits: Clear except for a chronic opacified contracted right maxillary sinus. Other: None IMPRESSION: 1. No acute finding by MRI. 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 Moderate chronic small-vessel ischemic changes of the cerebral hemispheric white matter. 2. Abnormal flow in the right internal carotid artery. This is consistent with occlusion or slow flow. 3. Chronic opacified contracted right maxillary sinus. Electronically Signed   By: Nelson Chimes M.D.   On:  07/14/2020 03:30   DG Chest Port 1 View  Result Date: 07/14/2020 CLINICAL DATA:  Confusion.  Unsure white came to the ER. EXAM: PORTABLE CHEST 1 VIEW COMPARISON:  Chest x-ray 05/04/2018 FINDINGS: Sternotomy wires and cardiac surgical changes again noted overlying the mediastinum. The heart size and mediastinal contours are within normal limits. Aortic arch calcifications. No focal consolidation. No pulmonary edema. No pleural effusion. No pneumothorax. No acute osseous abnormality. IMPRESSION: 1. No acute cardiopulmonary abnormality. 2.   Aortic Atherosclerosis (ICD10-I70.0). Electronically Signed   By: Iven Finn M.D.   On: 07/14/2020 02:43    Echocardiogram, CT head, MRI.,  Carotid duplex   Subjective: Seen and examined at bedside.  Patient seems improved.  Patient wants to be discharged home.  Patient is cleared from cardiology and neurology.  Discharge Exam: Vitals:   07/14/20 1030 07/14/20 1151  BP: 126/82 (!) 142/93  Pulse: 68 60  Resp: (!) 21 14  Temp:    SpO2: 99% 95%   Vitals:   07/14/20 0730 07/14/20 0830 07/14/20 1030 07/14/20 1151  BP: (!) 145/98 (!) 146/99 126/82 (!) 142/93  Pulse: (!) 59 62 68 60  Resp: 18 17 (!) 21 14  Temp:      TempSrc:      SpO2: 97% 92% 99% 95%  Weight:      Height:        General: Pt is alert, awake, not in acute distress Cardiovascular: RRR, S1/S2 +, no rubs, no gallops Respiratory: CTA bilaterally, no wheezing, no rhonchi Abdominal: Soft, NT, ND, bowel sounds + Extremities: no edema, no cyanosis    The results of significant diagnostics from this hospitalization (including imaging, microbiology, ancillary and laboratory) are listed below for reference.     Microbiology: No results found for this or any previous visit (from the past 240 hour(s)).   Labs: BNP (last 3 results) No results for input(s): BNP in the last 8760 hours. Basic Metabolic Panel: Recent Labs  Lab 07/13/20 2027  NA 137  K 3.8  CL 106  CO2 21*  GLUCOSE 122*  BUN 20  CREATININE 1.17  CALCIUM 8.7*   Liver Function Tests: Recent Labs  Lab 07/13/20 2027  AST 32  ALT 23  ALKPHOS 96  BILITOT 0.6  PROT 7.6  ALBUMIN 4.1   No results for input(s): LIPASE, AMYLASE in the last 168 hours. No results for input(s): AMMONIA in the last 168 hours. CBC: Recent Labs  Lab 07/13/20 2027  WBC 8.0  HGB 15.2  HCT 44.3  MCV 88.1  PLT 189   Cardiac Enzymes: No results for input(s): CKTOTAL, CKMB, CKMBINDEX, TROPONINI in the last 168 hours. BNP: Invalid input(s):  POCBNP CBG: Recent Labs  Lab 07/13/20 2022 07/14/20 0224 07/14/20 0746  GLUCAP 141* 106* 101*   D-Dimer No results for input(s): DDIMER in the last 72 hours. Hgb A1c No results for input(s): HGBA1C in the last 72 hours. Lipid Profile Recent Labs    07/14/20 0355  CHOL 140  HDL 41  LDLCALC 76  TRIG 116  CHOLHDL 3.4   Thyroid function studies No results for input(s): TSH, T4TOTAL, T3FREE, THYROIDAB in the last 72 hours.  Invalid input(s): FREET3 Anemia work up No results for input(s): VITAMINB12, FOLATE, FERRITIN, TIBC, IRON, RETICCTPCT in the last 72 hours. Urinalysis    Component Value Date/Time   COLORURINE STRAW (A) 07/13/2020 2028   APPEARANCEUR CLEAR (A) 07/13/2020 2028   LABSPEC 1.003 (L) 07/13/2020 2028   PHURINE 5.0 07/13/2020 2028  GLUCOSEU NEGATIVE 07/13/2020 2028   HGBUR NEGATIVE 07/13/2020 2028   BILIRUBINUR NEGATIVE 07/13/2020 2028   KETONESUR NEGATIVE 07/13/2020 2028   PROTEINUR NEGATIVE 07/13/2020 2028   UROBILINOGEN 1.0 12/02/2017 1503   NITRITE NEGATIVE 07/13/2020 2028   LEUKOCYTESUR NEGATIVE 07/13/2020 2028   Sepsis Labs Invalid input(s): PROCALCITONIN,  WBC,  LACTICIDVEN Microbiology No results found for this or any previous visit (from the past 240 hour(s)).   Time coordinating discharge: Over 30 minutes  SIGNED:   Shawna Clamp, MD  [facility] 07/14/2020, 2:15 PM Pager   If 7PM-7AM, please contact night-coverage [email or web address]

## 2020-07-14 NOTE — Discharge Instructions (Signed)
Advised to follow-up with primary care physician in 1 week.   Advised to follow-up with Dr. Nehemiah Massed in 1 week appointment has been made.

## 2020-07-14 NOTE — H&P (Signed)
History and Physical    Brendan Callahan KYH:[id number] DOB: 1946-05-09 DOA: 07/13/2020  PCP: System, Provider Not In   Patient coming from: Home  I have personally briefly reviewed patient's old medical records in [place]  Chief Complaint: Altered mental status  HPI: Brendan Callahan is a 75 y.o. male with medical history significant for CAD status post CABG, with negative stress test in 2021, status post recent preoperative clearance by his cardiologist as well as history of PAD, bilateral carotid artery stenosis with chronic complete occlusion on the left and 40 to 59% on the right per recent carotid Doppler by Dr. Lucky Cowboy on visit in January 2022, as well as history of DM and HTN, who lives alone who was brought in due to concern for altered mental status.  Most of the history is provided by his stepdaughter who states that usually he is mentally acute and she speaks to him daily however on the evening of presentation he appeared confused about recent events and kept repeating the same question over and over.  He was not recently ill.  He has had no fever or cough, chest pain or shortness of breath, vomiting or diarrhea or dysuria.   ED Course: On arrival in the emergency room patient was originally alert to self only and had no focal neurologic deficit.  Blood pressure was initially within normal limits however went as high as 204/97 in the ER with otherwise normal vitals.  Blood work was unremarkable and urinalysis normal.  Troponin was slightly elevated 47>54.  Covid pending EKG as reviewed by me : Sinus rhythm at 68 with no acute ST-T wave changes Imaging: CT head with no acute intracranial findings.  MRI pending.  Chest x-ray pending  Patient did not want to be admitted and was IVCD from the emergency room due to no safe discharge options given that he lives alone.  Hospitalist consulted for admission.  Review of Systems: As per HPI otherwise all other systems on review of systems  negative.    Past Medical History:  Diagnosis Date  . Acute diverticulitis 12/02/2017  . GERD (gastroesophageal reflux disease)   . Gout    "on daily RX" (12/02/2017)  . High cholesterol   . History of kidney stones   . Hypertension   . Type II diabetes mellitus ([place])     Past Surgical History:  Procedure Laterality Date  . BLADDER STONE REMOVAL  ~ 2004  . CORONARY ARTERY BYPASS GRAFT  2001   CABG X3"  . TONSILLECTOMY       reports that he has quit smoking. His smoking use included cigarettes. He has a 20.00 pack-year smoking history. He has never used smokeless tobacco. He reports current alcohol use. He reports that he does not use drugs.  No Known Allergies  Family History  Problem Relation Age of Onset  . Hernia Mother   . Healthy Father       Prior to Admission medications   Medication Sig Start Date End Date Taking? Authorizing Provider  allopurinol (ZYLOPRIM) 300 MG tablet Take 300 mg by mouth daily. 05/20/16 02/09/18  [provider]  aspirin 325 MG tablet Take 325 mg by mouth daily. Patient not taking: Reported on 06/12/2020 08/06/10   [provider]  atorvastatin (LIPITOR) 20 MG tablet Take 20 mg by mouth daily. 10/03/15 02/09/18  [provider]  azithromycin (ZITHROMAX) 250 MG tablet Take 1 tablet (250 mg total) by mouth daily. Take first 2 tablets together, then  1 every day until finished. Patient not taking: Reported on 06/12/2020 05/04/18   Raylene Everts, MD  clopidogrel (PLAVIX) 75 MG tablet Take by mouth. 02/23/19   [provider]  docusate sodium (COLACE) 100 MG capsule Take 1 capsule (100 mg total) by mouth 2 (two) times daily. Patient not taking: Reported on 06/12/2020 12/05/17   Modena Jansky, MD  gabapentin (NEURONTIN) 300 MG capsule Take 300 mg by mouth daily. 11/11/17 02/09/18  [provider]  ibuprofen (ADVIL,MOTRIN) 800 MG tablet Take 1 tablet (800 mg total) by mouth 3 (three) times daily. 05/04/18   Raylene Everts, MD  metFORMIN (GLUCOPHAGE) 500 MG tablet Take 500 mg by mouth daily. 05/17/16 06/04/18  [provider]  metoprolol succinate (TOPROL-XL) 50 MG 24 hr tablet Take 50 mg by mouth daily. 10/01/15 02/09/18  [provider]  Multiple Vitamin (MULTIVITAMIN WITH MINERALS) TABS tablet Take 1 tablet by mouth daily.    [provider]  pantoprazole (PROTONIX) 20 MG tablet Take 20 mg by mouth daily. 06/04/17 06/04/18  [provider]  sildenafil (VIAGRA) 100 MG tablet Take 100 mg by mouth daily as needed for erectile dysfunction. 10/01/15 02/09/18  [provider]  vitamin B-12 (CYANOCOBALAMIN) 100 MCG tablet Take 100 mcg by mouth daily. Patient not taking: Reported on 06/12/2020    [provider]    Physical Exam: Vitals:   07/13/20 2235 07/13/20 2330 07/14/20 0100 07/14/20 0115  BP: 140/81 139/77 (!) 190/156 (!) 204/97  Pulse: 67 67 66 60  Resp: 18 19 19 17   Temp:      TempSrc:      SpO2: 91% 90% 97% 100%  Weight:      Height:         Vitals:   07/13/20 2235 07/13/20 2330 07/14/20 0100 07/14/20 0115  BP: 140/81 139/77 (!) 190/156 (!) 204/97  Pulse: 67 67 66 60  Resp: 18 19 19 17   Temp:      TempSrc:      SpO2: 91% 90% 97% 100%  Weight:      Height:          Constitutional: Alert and oriented x 3 . Not in any apparent distress HEENT:      Head: Normocephalic and atraumatic.         Eyes: PERLA, EOMI, Conjunctivae are normal. Sclera is non-icteric.       Mouth/Throat: Mucous membranes are moist.       Neck: Supple with no signs of meningismus. Cardiovascular: Regular rate and rhythm. No murmurs, gallops, or rubs. 2+ symmetrical distal pulses are present . No JVD. No LE edema Respiratory: Respiratory effort normal .Lungs sounds clear bilaterally. No wheezes, crackles, or rhonchi.  Gastrointestinal: Soft, non tender, and non distended with positive bowel sounds.  Genitourinary: No CVA tenderness. Musculoskeletal: Nontender  with normal range of motion in all extremities. No cyanosis, or erythema of extremities. Neurologic:  Face is symmetric. Moving all extremities. No gross focal neurologic deficits . Skin: Skin is warm, dry.  No rash or ulcers Psychiatric: Mood and affect are normal    Labs on Admission: I have personally reviewed following labs and imaging studies  CBC: Recent Labs  Lab 07/13/20 2027  WBC 8.0  HGB 15.2  HCT 44.3  MCV 88.1  PLT 99991111   Basic Metabolic Panel: Recent Labs  Lab 07/13/20 2027  NA 137  K 3.8  CL 106  CO2 21*  GLUCOSE 122*  BUN 20  CREATININE 1.17  CALCIUM 8.7*   GFR: Estimated Creatinine Clearance: 64 mL/min (by C-G formula based on SCr of 1.17 mg/dL). Liver Function Tests: Recent Labs  Lab 07/13/20 2027  AST 32  ALT 23  ALKPHOS 96  BILITOT 0.6  PROT 7.6  ALBUMIN 4.1   No results for input(s): LIPASE, AMYLASE in the last 168 hours. No results for input(s): AMMONIA in the last 168 hours. Coagulation Profile: No results for input(s): INR, PROTIME in the last 168 hours. Cardiac Enzymes: No results for input(s): CKTOTAL, CKMB, CKMBINDEX, TROPONINI in the last 168 hours. BNP (last 3 results) No results for input(s): PROBNP in the last 8760 hours. HbA1C: No results for input(s): HGBA1C in the last 72 hours. CBG: Recent Labs  Lab 07/13/20 2022  GLUCAP 141*   Lipid Profile: No results for input(s): CHOL, HDL, LDLCALC, TRIG, CHOLHDL, LDLDIRECT in the last 72 hours. Thyroid Function Tests: No results for input(s): TSH, T4TOTAL, FREET4, T3FREE, THYROIDAB in the last 72 hours. Anemia Panel: No results for input(s): VITAMINB12, FOLATE, FERRITIN, TIBC, IRON, RETICCTPCT in the last 72 hours. Urine analysis:    Component Value Date/Time   COLORURINE STRAW (A) 07/13/2020 2028   APPEARANCEUR CLEAR (A) 07/13/2020 2028   LABSPEC 1.003 (L) 07/13/2020 2028   PHURINE 5.0 07/13/2020 2028   GLUCOSEU NEGATIVE 07/13/2020 2028   HGBUR NEGATIVE 07/13/2020 2028    BILIRUBINUR NEGATIVE 07/13/2020 2028   KETONESUR NEGATIVE 07/13/2020 2028   PROTEINUR NEGATIVE 07/13/2020 2028   UROBILINOGEN 1.0 12/02/2017 1503   NITRITE NEGATIVE 07/13/2020 2028   LEUKOCYTESUR NEGATIVE 07/13/2020 2028    Radiological Exams on Admission: CT Head Wo Contrast  Result Date: 07/13/2020 CLINICAL DATA:  Delirium. EXAM: CT HEAD WITHOUT CONTRAST TECHNIQUE: Contiguous axial images were obtained from the base of the skull through the vertex without intravenous contrast. COMPARISON:  None. FINDINGS: Brain: Remote infarct involving the basal ganglia with extension to the overlying corona radiata. There is adjacent ex vacuo dilation of the frontal horn of the right lateral ventricle. No evidence of acute large vascular territory infarct. Patchy white matter hypoattenuation, most likely related to chronic microvascular ischemic disease. No acute hemorrhage. No hydrocephalus. No mass lesion or abnormal mass effect. Vascular: Calcific atherosclerosis. No hyperdense vessel identified. Skull: No acute fracture. Sinuses/Orbits: Opacified small right maxillary sinus with associated wall thickening. Scattered ethmoid air cell opacification. Other: No mastoid effusions. IMPRESSION: 1. No evidence of acute intracranial abnormality. 2. 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 3. Paranasal sinus disease, including likely chronic right maxillary sinus disease. Electronically Signed   By: Margaretha Sheffield MD   On: 07/13/2020 21:16     Assessment/Plan 75 year old male with history of CAD status post CABG, with negative stress test in 2021, status post recent preoperative clearance by his cardiologist as well as history of PAD, bilateral carotid artery stenosis with chronic complete occlusion on the left and 40 to 59% on the right per recent carotid Doppler by Dr. Lucky Cowboy on visit in January 2022, as well as history of DM and  HTN, who lives alone who was brought in due to concern for altered mental status.    TIA vs acute metabolic encephalopathy -Patient who lives alone and is usually functionally independent presenting with confusion and altered mental status over the past 12 hours prior to arrival -Patient was confused but without focal deficits in the emergency room -Concern for possible CVA versus PRES -BP elevated at 204/97  in the emergency room.  Initial head CT negative -Concern for possible CVA versus press -Follow-up MRI -Neurology consult -Patient has been IVC'd from the emergency room as it was not believed safe for him to be discharged.  We will continue    Carotid artery stenosis -Patient with occluded left carotid artery, 40 to 59% on the right seen by Dr. Lucky Cowboy vascular surgeon in January 2022 -Continue dual antiplatelet therapy with aspirin and Plavix as well as statin -Follow-up MRI given risk factors for CVA  Elevated troponin   CAD with history of CABG (coronary artery disease) -Troponin 47>54 but patient denies chest pain and EKG is nonacute -Continue antiplatelets and statins -We will get echocardiogram as no recent on record  Uncontrolled hypertension -Elevated BP of 204/97 -Continue home antihypertensives but permissive hypertension in the event MRI rules and CVA    Hyperlipidemia -Continue statin    Type II diabetes mellitus with complication ([facility]) -Sliding scale insulin coverage    DVT prophylaxis: Lovenox  Code Status: full code  Family Communication: Stepdaughter at bedside Disposition Plan: Back to previous home environment Consults called: Neurology Status: Observation    Athena Masse MD [facility]     07/14/2020, 1:50 AM

## 2020-07-14 NOTE — ED Provider Notes (Signed)
[facility] Emergency Department Provider Note  ____________________________________________   Event Date/Time   First MD Initiated Contact with Patient 07/13/20 2307     (approximate)  I have reviewed the triage vital signs and the nursing notes.   HISTORY  Chief Complaint Altered Mental Status    HPI Brendan Callahan is a 75 y.o. male with history of hypertension, diabetes, hyperlipidemia who presents to the emergency department with his stepdaughter for concerns for altered mental status.  She reports that she talked to him on the phone yesterday and he was normal.  She communicated with him over text message this morning and he appeared to be normal.  She states tonight he called her asking if she had bent over to his house.  He told her that he had a new washer and dryer in his house and he was not sure how it got there.  She states that he just purchased this washer and dryer and it was delivered yesterday.  She told him that she had not been to his house.  She states he repeatedly stated that her mother was there but her mother was not with the patient, she was in [place].  She states that he had repetitive questioning on the phone.  She became concerned about his confusion and went to see him.  She states that he was also confused about a new car that he had at the house that he just recently purchased.  She states he could not remember that he had purchased this vehicle.  No known infectious symptoms recently.  No known falls.  Patient does live at home alone.  He denies having any pain currently.  He denies chest pain or shortness of breath.  No abdominal pain.        Past Medical History:  Diagnosis Date  . Acute diverticulitis 12/02/2017  . GERD (gastroesophageal reflux disease)   . Gout    "on daily RX" (12/02/2017)  . High cholesterol   . History of kidney stones   . Hypertension   . Type II diabetes mellitus [facility])     Patient Active Problem  List   Diagnosis Date Noted  . Acute metabolic encephalopathy 09/38/1829  . Neuropathy of both feet 01/25/2018  . Acute diverticulitis 12/02/2017  . CAD (coronary artery disease) 12/02/2017  . DM (diabetes mellitus) ([place]) 12/02/2017  . Gout 12/02/2017  . GERD (gastroesophageal reflux disease) 12/02/2017  . Type II diabetes mellitus with complication ([place]) 93/71/6967  . Long term current use of anticoagulant 07/03/2016  . Medicare annual wellness visit, initial 06/30/2016  . Hyperlipidemia associated with type 2 diabetes mellitus ([place]) 06/30/2016  . Hypertension associated with diabetes ([place]) 06/30/2016  . Diffuse photodamage of skin 01/30/2016  . Adjustment insomnia 01/21/2016  . Change in mole 01/21/2016  . Productive cough 01/21/2016  . Hyperlipidemia LDL goal <100 10/02/2014  . 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 Gout, arthritis 10/02/2014  . Carotid artery stenosis 08/04/2011  . Subclavian steal syndrome 08/04/2011  . Benign essential hypertension 12/25/2010  . ED (erectile dysfunction) of organic origin 12/25/2010    Past Surgical History:  Procedure Laterality Date  . BLADDER STONE REMOVAL  ~ 2004  . CORONARY ARTERY BYPASS GRAFT  2001   CABG X3"  . TONSILLECTOMY      Prior to Admission medications   Medication Sig Start Date End Date Taking? Authorizing Provider  allopurinol (ZYLOPRIM) 300 MG tablet Take 300 mg by  mouth daily. 05/20/16 02/09/18  [provider]  aspirin 325 MG tablet Take 325 mg by mouth daily. Patient not taking: Reported on 06/12/2020 08/06/10   [provider]  atorvastatin (LIPITOR) 20 MG tablet Take 20 mg by mouth daily. 10/03/15 02/09/18  [provider]  azithromycin (ZITHROMAX) 250 MG tablet Take 1 tablet (250 mg total) by mouth daily. Take first 2 tablets together, then 1 every day until finished. Patient not taking: Reported on 06/12/2020 05/04/18   Raylene Everts, MD   clopidogrel (PLAVIX) 75 MG tablet Take by mouth. 02/23/19   [provider]  docusate sodium (COLACE) 100 MG capsule Take 1 capsule (100 mg total) by mouth 2 (two) times daily. Patient not taking: Reported on 06/12/2020 12/05/17   Modena Jansky, MD  gabapentin (NEURONTIN) 300 MG capsule Take 300 mg by mouth daily. 11/11/17 02/09/18  [provider]  ibuprofen (ADVIL,MOTRIN) 800 MG tablet Take 1 tablet (800 mg total) by mouth 3 (three) times daily. 05/04/18   Raylene Everts, MD  metFORMIN (GLUCOPHAGE) 500 MG tablet Take 500 mg by mouth daily. 05/17/16 06/04/18  [provider]  metoprolol succinate (TOPROL-XL) 50 MG 24 hr tablet Take 50 mg by mouth daily. 10/01/15 02/09/18  [provider]  Multiple Vitamin (MULTIVITAMIN WITH MINERALS) TABS tablet Take 1 tablet by mouth daily.    [provider]  pantoprazole (PROTONIX) 20 MG tablet Take 20 mg by mouth daily. 06/04/17 06/04/18  [provider]  sildenafil (VIAGRA) 100 MG tablet Take 100 mg by mouth daily as needed for erectile dysfunction. 10/01/15 02/09/18  [provider]  vitamin B-12 (CYANOCOBALAMIN) 100 MCG tablet Take 100 mcg by mouth daily. Patient not taking: Reported on 06/12/2020    [provider]    Allergies Patient has no known allergies.  Family History  Problem Relation Age of Onset  . Hernia Mother   . Healthy Father     Social History Social History   Tobacco Use  . Smoking status: Former Smoker    Packs/day: 1.00    Years: 20.00    Pack years: 20.00    Types: Cigarettes  . Smokeless tobacco: Never Used  . Tobacco comment: 12/02/2017 "nothing since 1980s"  Vaping Use  . Vaping Use: Never used  Substance Use Topics  . Alcohol use: Yes    Comment: 12/02/2017 "couple beers/month"  . Drug use: Never    Review of Systems Level 5 caveat secondary to altered mental status.  ____________________________________________   PHYSICAL EXAM:  VITAL  SIGNS: ED Triage Vitals  Enc Vitals Group     BP 07/13/20 2020 (!) 150/107     Pulse Rate 07/13/20 2020 90     Resp 07/13/20 2020 20     Temp 07/13/20 2020 98.4 F (36.9 C)     Temp Source 07/13/20 2020 Oral     SpO2 07/13/20 2020 93 %     Weight 07/13/20 2021 215 lb (97.5 kg)     Height 07/13/20 2021 5\' 11"  (1.803 m)     Head Circumference --      Peak Flow --      Pain Score 07/13/20 2021 0     Pain Loc --      Pain Edu? --      Excl. in GC? --    CONSTITUTIONAL: Alert and oriented to person, place and year but not day of the week or month.  Repetitive questioning.  Becomes upset when told he  has to stay in the hospital. HEAD: Normocephalic, appears atraumatic EYES: Conjunctivae clear, pupils appear equal, EOM appear intact ENT: normal nose; moist mucous membranes NECK: Supple, normal ROM CARD: RRR; S1 and S2 appreciated; no murmurs, no clicks, no rubs, no gallops RESP: Normal chest excursion without splinting or tachypnea; breath sounds clear and equal bilaterally; no wheezes, no rhonchi, no rales, no hypoxia or respiratory distress, speaking full sentences ABD/GI: Normal bowel sounds; non-distended; soft, non-tender, no rebound, no guarding, no peritoneal signs, no hepatosplenomegaly BACK: The back appears normal EXT: Normal ROM in all joints; no deformity noted, no edema; no cyanosis SKIN: Normal color for age and race; warm; no rash on exposed skin NEURO: Moves all extremities equally, sensation to light touch intact diffusely, no drift, cranial nerves II through XII intact, normal speech PSYCH: The patient's mood and manner are appropriate.  ____________________________________________   LABS (all labs ordered are listed, but only abnormal results are displayed)  Labs Reviewed  COMPREHENSIVE METABOLIC PANEL - Abnormal; Notable for the following components:      Result Value   CO2 21 (*)    Glucose, Bld 122 (*)    Calcium 8.7 (*)    All other components within normal  limits  URINALYSIS, COMPLETE (UACMP) WITH MICROSCOPIC - Abnormal; Notable for the following components:   Color, Urine STRAW (*)    APPearance CLEAR (*)    Specific Gravity, Urine 1.003 (*)    All other components within normal limits  CBG MONITORING, ED - Abnormal; Notable for the following components:   Glucose-Capillary 141 (*)    All other components within normal limits  CBG MONITORING, ED - Abnormal; Notable for the following components:   Glucose-Capillary 106 (*)    All other components within normal limits  TROPONIN I (HIGH SENSITIVITY) - Abnormal; Notable for the following components:   Troponin I (High Sensitivity) 47 (*)    All other components within normal limits  TROPONIN I (HIGH SENSITIVITY) - Abnormal; Notable for the following components:   Troponin I (High Sensitivity) 54 (*)    All other components within normal limits  URINE CULTURE  SARS CORONAVIRUS 2 (TAT 6-24 HRS)  CBC  HEMOGLOBIN A1C  LIPID PANEL   ____________________________________________  EKG  .  EKG Interpretation  Date/Time:  Friday July 13 2020 20:16:27 EST Ventricular Rate:  90 PR Interval:  172 QRS Duration: 76 QT Interval:  342 QTC Calculation: 418 R Axis:   0 Text Interpretation: Normal sinus rhythm Possible Anterior infarct , age undetermined Abnormal ECG No old tracing to compare Confirmed by Pryor Curia [id number]) on 07/13/2020 11:13:38 PM       ____________________________________________  RADIOLOGY Jessie Foot Biagio Snelson, personally viewed and evaluated these images (plain radiographs) as part of my medical decision making, as well as reviewing the written report by the radiologist.  ED MD interpretation: CT head shows no acute abnormality.  Official radiology report(s): CT Head Wo Contrast  Result Date: 07/13/2020 CLINICAL DATA:  Delirium. EXAM: CT HEAD WITHOUT CONTRAST TECHNIQUE: Contiguous axial images were obtained from the base of the skull through the vertex without  intravenous contrast. COMPARISON:  None. FINDINGS: Brain: Remote infarct involving the basal ganglia with extension to the overlying corona radiata. There is adjacent ex vacuo dilation of the frontal horn of the right lateral ventricle. No evidence of acute large vascular territory infarct. Patchy white matter hypoattenuation, most likely related to chronic microvascular ischemic disease. No acute hemorrhage. No hydrocephalus. No mass lesion or abnormal mass  effect. Vascular: Calcific atherosclerosis. No hyperdense vessel identified. Skull: No acute fracture. Sinuses/Orbits: Opacified small right maxillary sinus with associated wall thickening. Scattered ethmoid air cell opacification. Other: No mastoid effusions. IMPRESSION: 1. No evidence of acute intracranial abnormality. 2. 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 3. Paranasal sinus disease, including likely chronic right maxillary sinus disease. Electronically Signed   By: Margaretha Sheffield MD   On: 07/13/2020 21:16   MR BRAIN WO CONTRAST  Result Date: 07/14/2020 CLINICAL DATA:  Mental status changes of unknown cause.  Confusion. EXAM: MRI HEAD WITHOUT CONTRAST TECHNIQUE: Multiplanar, multiecho pulse sequences of the brain and surrounding structures were obtained without intravenous contrast. COMPARISON:  Head CT yesterday. FINDINGS: Brain: Diffusion imaging does not show any acute or subacute infarction. No focal abnormality affects the brainstem or cerebellum. Cerebral hemispheres show old infarction in the right basal ganglia and moderate chronic small-vessel ischemic changes of the deep and subcortical white matter. No cortical or large vessel territory infarction. No mass lesion, hemorrhage, hydrocephalus or extra-axial collection. Some old hemosiderin deposition in the region of the right basal ganglia stroke. Vascular: Abnormal flow in the right internal carotid  artery. Other major vessels show flow. Skull and upper cervical spine: Normal Sinuses/Orbits: Clear except for a chronic opacified contracted right maxillary sinus. Other: None IMPRESSION: 1. No acute finding by MRI. 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 Moderate chronic small-vessel ischemic changes of the cerebral hemispheric white matter. 2. Abnormal flow in the right internal carotid artery. This is consistent with occlusion or slow flow. 3. Chronic opacified contracted right maxillary sinus. Electronically Signed   By: Nelson Chimes M.D.   On: 07/14/2020 03:30   DG Chest Port 1 View  Result Date: 07/14/2020 CLINICAL DATA:  Confusion.  Unsure white came to the ER. EXAM: PORTABLE CHEST 1 VIEW COMPARISON:  Chest x-ray 05/04/2018 FINDINGS: Sternotomy wires and cardiac surgical changes again noted overlying the mediastinum. The heart size and mediastinal contours are within normal limits. Aortic arch calcifications. No focal consolidation. No pulmonary edema. No pleural effusion. No pneumothorax. No acute osseous abnormality. IMPRESSION: 1. No acute cardiopulmonary abnormality. 2.  Aortic Atherosclerosis (ICD10-I70.0). Electronically Signed   By: Iven Finn M.D.   On: 07/14/2020 02:43    ____________________________________________   PROCEDURES  Procedure(s) performed (including Critical Care):  None  ____________________________________________   INITIAL IMPRESSION / ASSESSMENT AND PLAN / ED COURSE  As part of my medical decision making, I reviewed the following data within the [place] History obtained from family, Nursing notes reviewed and incorporated, Labs reviewed, EKG interpreted NSR, Radiograph reviewed, Discussed with admitting physician and Notes from prior ED visits         Patient here with altered mental status.  Work-up has been unrevealing other than mildly elevated troponins.  EKG nonischemic.  He denies chest pain or shortness of breath.  No old  troponins for comparison.  Urine does not appear infected.  No uremia.  No focal neurologic deficits here and head CT is unremarkable.  Patient is requesting to leave.  I do not feel he has capacity to make this decision as he still appears to be intermittently confused.  I have placed him under IVC.    Discussed patient's case with hospitalist, Dr. Damita Dunnings.  I have recommended admission and patient (and family if present) agree with this plan. Admitting physician will place admission orders.   I reviewed all nursing  notes, vitals, pertinent previous records and reviewed/interpreted all EKGs, lab and urine results, imaging (as available).   Patient is noted to be hypertensive here.  This is improving with his home metoprolol.  MRI of the brain ordered which shows no acute abnormality.  No signs of PRES. ____________________________________________   FINAL CLINICAL IMPRESSION(S) / ED DIAGNOSES  Final diagnoses:  Altered mental status, unspecified altered mental status type  Elevated troponin     ED Discharge Orders    None      *Please note:  Brendan Callahan was evaluated in Emergency Department on 07/14/2020 for the symptoms described in the history of present illness. He was evaluated in the context of the global COVID-19 pandemic, which necessitated consideration that the patient might be at risk for infection with the SARS-CoV-2 virus that causes COVID-19. Institutional protocols and algorithms that pertain to the evaluation of patients at risk for COVID-19 are in a state of rapid change based on information released by regulatory bodies including the CDC and federal and state organizations. These policies and algorithms were followed during the patient's care in the ED.  Some ED evaluations and interventions may be delayed as a result of limited staffing during and the pandemic.*   Note:  This document was prepared using Dragon voice recognition software and may include  unintentional dictation errors.   Luccia Reinheimer, Delice Bison, DO 07/14/20 [phone]

## 2020-07-14 NOTE — ED Notes (Signed)
Pt connected to cardiac monitor.

## 2020-07-14 NOTE — ED Notes (Signed)
Dr. Kumar, MD at bedside.  

## 2020-07-14 NOTE — ED Notes (Signed)
Pt ambulatory to the restroom without assistance. Pt has a steady gait.

## 2020-07-14 NOTE — ED Notes (Signed)
Patient transported to MRI 

## 2020-07-14 NOTE — ED Notes (Signed)
Breakfast meal tray given at this time.  

## 2020-07-14 NOTE — Progress Notes (Signed)
*  PRELIMINARY RESULTS* Echocardiogram 2D Echocardiogram has been performed. Definity IV Contrast used on this study.  Brendan Callahan [name] 07/14/2020, 11:30 AM

## 2020-07-14 NOTE — ED Notes (Signed)
Pt ambulatory to the restroom without assistance.  

## 2020-07-14 NOTE — ED Notes (Signed)
Dr. Saralyn Pilar at bedside at this time.

## 2020-07-16 LAB — URINE CULTURE: Culture: <10000 — AB

## 2020-07-16 LAB — CBG MONITORING, ED: Glucose-Capillary: 94 mg/dL (ref 70–99)

## 2020-12-26 ENCOUNTER — Encounter (HOSPITAL_BASED_OUTPATIENT_CLINIC_OR_DEPARTMENT_OTHER): Payer: Medicare HMO | Admitting: Internal Medicine

## 2021-04-23 ENCOUNTER — Other Ambulatory Visit: Payer: Self-pay | Admitting: Family Medicine

## 2021-04-23 ENCOUNTER — Ambulatory Visit
Admission: RE | Admit: 2021-04-23 | Discharge: 2021-04-23 | Disposition: A | Discharge status: Home / Self Care | Payer: Medicare HMO | Source: Ambulatory Visit | Attending: Family Medicine | Admitting: Family Medicine

## 2021-04-23 DIAGNOSIS — M62838 Other muscle spasm: Secondary | ICD-10-CM

## 2021-04-24 ENCOUNTER — Other Ambulatory Visit: Payer: Self-pay | Admitting: Family Medicine

## 2021-06-11 ENCOUNTER — Encounter (INDEPENDENT_AMBULATORY_CARE_PROVIDER_SITE_OTHER): Payer: Medicare HMO

## 2021-06-11 ENCOUNTER — Ambulatory Visit (INDEPENDENT_AMBULATORY_CARE_PROVIDER_SITE_OTHER): Payer: Medicare HMO | Admitting: Vascular Surgery

## 2021-07-12 ENCOUNTER — Encounter (INDEPENDENT_AMBULATORY_CARE_PROVIDER_SITE_OTHER): Payer: Medicare HMO

## 2021-07-12 ENCOUNTER — Ambulatory Visit (INDEPENDENT_AMBULATORY_CARE_PROVIDER_SITE_OTHER): Payer: Medicare HMO | Admitting: Vascular Surgery

## 2021-07-15 ENCOUNTER — Other Ambulatory Visit: Payer: Self-pay | Admitting: Family Medicine

## 2021-07-15 DIAGNOSIS — R0989 Other specified symptoms and signs involving the circulatory and respiratory systems: Secondary | ICD-10-CM

## 2021-07-17 ENCOUNTER — Other Ambulatory Visit: Payer: Self-pay | Admitting: Family Medicine

## 2021-07-17 DIAGNOSIS — R0989 Other specified symptoms and signs involving the circulatory and respiratory systems: Secondary | ICD-10-CM

## 2021-07-18 ENCOUNTER — Ambulatory Visit
Admission: RE | Admit: 2021-07-18 | Discharge: 2021-07-18 | Disposition: A | Discharge status: Home / Self Care | Payer: Medicare HMO | Source: Ambulatory Visit | Attending: Family Medicine | Admitting: Family Medicine

## 2021-07-18 DIAGNOSIS — R0989 Other specified symptoms and signs involving the circulatory and respiratory systems: Secondary | ICD-10-CM

## 2021-08-12 ENCOUNTER — Other Ambulatory Visit (INDEPENDENT_AMBULATORY_CARE_PROVIDER_SITE_OTHER): Payer: Self-pay | Admitting: Vascular Surgery

## 2021-08-12 DIAGNOSIS — I6523 Occlusion and stenosis of bilateral carotid arteries: Secondary | ICD-10-CM

## 2021-08-13 ENCOUNTER — Encounter (INDEPENDENT_AMBULATORY_CARE_PROVIDER_SITE_OTHER): Payer: Medicare HMO

## 2021-08-13 ENCOUNTER — Encounter (INDEPENDENT_AMBULATORY_CARE_PROVIDER_SITE_OTHER): Payer: Self-pay

## 2021-08-13 ENCOUNTER — Ambulatory Visit (INDEPENDENT_AMBULATORY_CARE_PROVIDER_SITE_OTHER): Payer: Medicare HMO | Admitting: Vascular Surgery

## 2021-10-08 ENCOUNTER — Ambulatory Visit (INDEPENDENT_AMBULATORY_CARE_PROVIDER_SITE_OTHER): Payer: Medicare HMO | Admitting: Vascular Surgery

## 2021-10-08 ENCOUNTER — Encounter (INDEPENDENT_AMBULATORY_CARE_PROVIDER_SITE_OTHER): Payer: Self-pay | Admitting: Vascular Surgery

## 2021-10-08 ENCOUNTER — Ambulatory Visit (INDEPENDENT_AMBULATORY_CARE_PROVIDER_SITE_OTHER): Payer: Medicare HMO

## 2021-10-08 VITALS — BP 163/95 | HR 60 | Resp 18 | Ht 70.0 in | Wt 228.0 lb

## 2021-10-08 DIAGNOSIS — I6523 Occlusion and stenosis of bilateral carotid arteries: Secondary | ICD-10-CM

## 2021-10-08 DIAGNOSIS — E785 Hyperlipidemia, unspecified: Secondary | ICD-10-CM

## 2021-10-08 DIAGNOSIS — I10 Essential (primary) hypertension: Secondary | ICD-10-CM

## 2021-10-08 DIAGNOSIS — E118 Type 2 diabetes mellitus with unspecified complications: Secondary | ICD-10-CM

## 2021-10-08 NOTE — Progress Notes (Signed)
? ? ?Patient ID: Brendan Callahan, male   DOB: 07/27/1945, 76 y.o.   MRN: [id number] ? ?Chief Complaint  ?Patient presents with  ? Establish Care  ? ? ?HPI ?Brendan Callahan is a 76 y.o. male.  I am asked to see the patient by Dr. Nehemiah Massed for evaluation of carotid stenosis.  I had previously seen him about a year and a half ago at which time he was found to have a chronic right carotid artery occlusion with 40 to 59% stenosis of the left carotid artery.  A carotid ultrasound earlier this year by his cardiologist that suggested progression up into the greater than 70% range on the left side with a known right carotid.  The patient denies any focal neurologic symptoms cerebrovascular ischemia. Specifically, the patient denies amaurosis fugax, speech or swallowing difficulties, or arm or leg weakness or numbness.  We repeated a carotid duplex today demonstrated a chronic right carotid artery occlusion with velocities that would fall in our 80 to 99% stenosis range on the left side ? ? ?Past Medical History:  ?Diagnosis Date  ? Acute diverticulitis 12/02/2017  ? GERD (gastroesophageal reflux disease)   ? Gout   ? "on daily RX" (12/02/2017)  ? High cholesterol   ? History of kidney stones   ? Hypertension   ? Type II diabetes mellitus ([place])   ? ? ?Past Surgical History:  ?Procedure Laterality Date  ? BLADDER STONE REMOVAL  ~ 2004  ? CORONARY ARTERY BYPASS GRAFT  2001  ? CABG X3"  ? TONSILLECTOMY    ? ? ? ?Family History  ?Problem Relation Age of Onset  ? Hernia Mother   ? Healthy Father   ?No bleeding or clotting disorders ?No aneurysms ? ? ?Social History  ? ?Tobacco Use  ? Smoking status: Former  ?  Packs/day: 1.00  ?  Years: 20.00  ?  Pack years: 20.00  ?  Types: Cigarettes  ? Smokeless tobacco: Never  ? Tobacco comments:  ?  12/02/2017 "nothing since 1980s"  ?Vaping Use  ? Vaping Use: Never used  ?Substance Use Topics  ? Alcohol use: Yes  ?  Comment: 12/02/2017 "couple beers/month"  ? Drug use: Never  ? ? ? ?No Known  Allergies ? ?Current Outpatient Medications  ?Medication Sig Dispense Refill  ? allopurinol (ZYLOPRIM) 300 MG tablet Take 300 mg by mouth daily.    ? atorvastatin (LIPITOR) 20 MG tablet Take 20 mg by mouth daily.    ? clopidogrel (PLAVIX) 75 MG tablet Take 75 mg by mouth daily.    ? gabapentin (NEURONTIN) 300 MG capsule Take 300 mg by mouth daily.    ? metFORMIN (GLUCOPHAGE) 500 MG tablet Take 500 mg by mouth daily.    ? metoprolol succinate (TOPROL-XL) 50 MG 24 hr tablet Take 50 mg by mouth daily.    ? Multiple Vitamin (MULTIVITAMIN WITH MINERALS) TABS tablet Take 1 tablet by mouth daily.    ? pantoprazole (PROTONIX) 20 MG tablet Take 20 mg by mouth daily.    ? sildenafil (VIAGRA) 100 MG tablet Take 100 mg by mouth as directed.    ? TRUE METRIX BLOOD GLUCOSE TEST test strip SMARTSIG:Via Meter    ? TRUEplus Lancets 33G MISC     ? ?No current facility-administered medications for this visit.  ? ? ? ? ?REVIEW OF SYSTEMS (Negative unless checked) ? ?Constitutional: '[]'$ Weight loss  '[]'$ Fever  '[]'$ Chills ?Cardiac: '[]'$ Chest pain   '[]'$ Chest pressure   '[]'$ Palpitations   '[]'$ Shortness of breath  when laying flat   '[]'$ Shortness of breath at rest   '[]'$ Shortness of breath with exertion. ?Vascular:  '[]'$ Pain in legs with walking   '[]'$ Pain in legs at rest   '[]'$ Pain in legs when laying flat   '[]'$ Claudication   '[]'$ Pain in feet when walking  '[]'$ Pain in feet at rest  '[]'$ Pain in feet when laying flat   '[]'$ History of DVT   '[]'$ Phlebitis   '[]'$ Swelling in legs   '[]'$ Varicose veins   '[]'$ Non-healing ulcers ?Pulmonary:   '[]'$ Uses home oxygen   '[]'$ Productive cough   '[]'$ Hemoptysis   '[]'$ Wheeze  '[]'$ COPD   '[]'$ Asthma ?Neurologic:  '[]'$ Dizziness  '[]'$ Blackouts   '[]'$ Seizures   '[]'$ History of stroke   '[]'$ History of TIA  '[]'$ Aphasia   '[]'$ Temporary blindness   '[]'$ Dysphagia   '[]'$ Weakness or numbness in arms   '[]'$ Weakness or numbness in legs ?Musculoskeletal:  '[x]'$ Arthritis   '[]'$ Joint swelling   '[x]'$ Joint pain   '[]'$ Low back pain ?Hematologic:  '[]'$ Easy bruising  '[]'$ Easy bleeding   '[]'$ Hypercoagulable state    '[]'$ Anemic  '[]'$ Hepatitis ?Gastrointestinal:  '[]'$ Blood in stool   '[]'$ Vomiting blood  '[x]'$ Gastroesophageal reflux/heartburn   '[]'$ Abdominal pain ?Genitourinary:  '[]'$ Chronic kidney disease   '[]'$ Difficult urination  '[]'$ Frequent urination  '[]'$ Burning with urination   '[]'$ Hematuria ?Skin:  '[]'$ Rashes   '[]'$ Ulcers   '[]'$ Wounds ?Psychological:  '[]'$ History of anxiety   '[]'$  History of major depression. ? ? ? ?Physical Exam ?BP (!) 163/95 (BP Location: Left Arm)   Pulse 60   Resp 18   Ht '5\' 10"'$  (1.778 m)   Wt 228 lb (103.4 kg)   BMI 32.71 kg/m?  ?Gen:  WD/WN, NAD ?Head: [place]/AT, No temporalis wasting.  ?Ear/Nose/Throat: Hearing grossly intact, nares w/o erythema or drainage, oropharynx w/o Erythema/Exudate ?Eyes: Conjunctiva clear, sclera non-icteric  ?Neck: trachea midline.  No JVD. Left carotid bruit ?Pulmonary:  Good air movement, respirations not labored, no use of accessory muscles  ?Cardiac: RRR, no JVD ?Vascular:  ?Vessel Right Left  ?Radial Palpable Palpable  ?    ?    ?    ?    ?    ?    ?    ?    ? ?Gastrointestinal:. No masses, surgical incisions, or scars. ?Musculoskeletal: M/S 5/5 throughout.  Extremities without ischemic changes.  No deformity or atrophy. No edema. ?Neurologic: Sensation grossly intact in extremities.  Symmetrical.  Speech is fluent. Motor exam as listed above. ?Psychiatric: Judgment intact, Mood & affect appropriate for pt's clinical situation. ?Dermatologic: No rashes or ulcers noted.  No cellulitis or open wounds. ? ? ? ?Radiology ?No results found. ? ?Labs ?No results found for this or any previous visit (from the past 2160 hour(s)). ? ?Assessment/Plan: ? ?Carotid artery stenosis ?We repeated a carotid duplex today demonstrated a chronic right carotid artery occlusion with velocities that would fall in our 80 to 99% stenosis range on the left side.  This has been a significant progression in his disease over the past year and a half but now 2 duplexes would suggest this is now a high-grade lesion.  With the  contralateral occlusion, carotid stenting would be significantly preferred to avoid having to clamp the artery.  A carotid angiogram would be the first portion of this procedure, and this will determine whether or not the anatomy is acceptable for endovascular therapy.  Risks and benefits were discussed with the patient and he is agreeable to proceed. ? ?Benign essential hypertension ?blood pressure control important in reducing the progression of atherosclerotic disease. On appropriate oral  medications. ? ? ?Type II diabetes mellitus with complication (HCC) ?blood glucose control important in reducing the progression of atherosclerotic disease. Also, involved in wound healing. On appropriate medications. ? ? ?Hyperlipidemia LDL goal <100 ?lipid control important in reducing the progression of atherosclerotic disease. Continue statin therapy ? ? ? ? ? ?Leotis Pain ?10/09/2021, 10:45 AM ? ? ?This note was created with Dragon medical transcription system.  Any errors from dictation are unintentional.    ?

## 2021-10-08 NOTE — H&P (View-Only) (Signed)
? ? ?Patient ID: Brendan Callahan, male   DOB: 04/13/1946, 76 y.o.   MRN: [id number] ? ?Chief Complaint  ?Patient presents with  ? Establish Care  ? ? ?HPI ?Brendan Callahan is a 76 y.o. male.  I am asked to see the patient by Dr. Nehemiah Massed for evaluation of carotid stenosis.  I had previously seen him about a year and a half ago at which time he was found to have a chronic right carotid artery occlusion with 40 to 59% stenosis of the left carotid artery.  A carotid ultrasound earlier this year by his cardiologist that suggested progression up into the greater than 70% range on the left side with a known right carotid.  The patient denies any focal neurologic symptoms cerebrovascular ischemia. Specifically, the patient denies amaurosis fugax, speech or swallowing difficulties, or arm or leg weakness or numbness.  We repeated a carotid duplex today demonstrated a chronic right carotid artery occlusion with velocities that would fall in our 80 to 99% stenosis range on the left side ? ? ?Past Medical History:  ?Diagnosis Date  ? Acute diverticulitis 12/02/2017  ? GERD (gastroesophageal reflux disease)   ? Gout   ? "on daily RX" (12/02/2017)  ? High cholesterol   ? History of kidney stones   ? Hypertension   ? Type II diabetes mellitus ([place])   ? ? ?Past Surgical History:  ?Procedure Laterality Date  ? BLADDER STONE REMOVAL  ~ 2004  ? CORONARY ARTERY BYPASS GRAFT  2001  ? CABG X3"  ? TONSILLECTOMY    ? ? ? ?Family History  ?Problem Relation Age of Onset  ? Hernia Mother   ? Healthy Father   ?No bleeding or clotting disorders ?No aneurysms ? ? ?Social History  ? ?Tobacco Use  ? Smoking status: Former  ?  Packs/day: 1.00  ?  Years: 20.00  ?  Pack years: 20.00  ?  Types: Cigarettes  ? Smokeless tobacco: Never  ? Tobacco comments:  ?  12/02/2017 "nothing since 1980s"  ?Vaping Use  ? Vaping Use: Never used  ?Substance Use Topics  ? Alcohol use: Yes  ?  Comment: 12/02/2017 "couple beers/month"  ? Drug use: Never  ? ? ? ?No Known  Allergies ? ?Current Outpatient Medications  ?Medication Sig Dispense Refill  ? allopurinol (ZYLOPRIM) 300 MG tablet Take 300 mg by mouth daily.    ? atorvastatin (LIPITOR) 20 MG tablet Take 20 mg by mouth daily.    ? clopidogrel (PLAVIX) 75 MG tablet Take 75 mg by mouth daily.    ? gabapentin (NEURONTIN) 300 MG capsule Take 300 mg by mouth daily.    ? metFORMIN (GLUCOPHAGE) 500 MG tablet Take 500 mg by mouth daily.    ? metoprolol succinate (TOPROL-XL) 50 MG 24 hr tablet Take 50 mg by mouth daily.    ? Multiple Vitamin (MULTIVITAMIN WITH MINERALS) TABS tablet Take 1 tablet by mouth daily.    ? pantoprazole (PROTONIX) 20 MG tablet Take 20 mg by mouth daily.    ? sildenafil (VIAGRA) 100 MG tablet Take 100 mg by mouth as directed.    ? TRUE METRIX BLOOD GLUCOSE TEST test strip SMARTSIG:Via Meter    ? TRUEplus Lancets 33G MISC     ? ?No current facility-administered medications for this visit.  ? ? ? ? ?REVIEW OF SYSTEMS (Negative unless checked) ? ?Constitutional: '[]'$ Weight loss  '[]'$ Fever  '[]'$ Chills ?Cardiac: '[]'$ Chest pain   '[]'$ Chest pressure   '[]'$ Palpitations   '[]'$ Shortness of breath  when laying flat   '[]'$ Shortness of breath at rest   '[]'$ Shortness of breath with exertion. ?Vascular:  '[]'$ Pain in legs with walking   '[]'$ Pain in legs at rest   '[]'$ Pain in legs when laying flat   '[]'$ Claudication   '[]'$ Pain in feet when walking  '[]'$ Pain in feet at rest  '[]'$ Pain in feet when laying flat   '[]'$ History of DVT   '[]'$ Phlebitis   '[]'$ Swelling in legs   '[]'$ Varicose veins   '[]'$ Non-healing ulcers ?Pulmonary:   '[]'$ Uses home oxygen   '[]'$ Productive cough   '[]'$ Hemoptysis   '[]'$ Wheeze  '[]'$ COPD   '[]'$ Asthma ?Neurologic:  '[]'$ Dizziness  '[]'$ Blackouts   '[]'$ Seizures   '[]'$ History of stroke   '[]'$ History of TIA  '[]'$ Aphasia   '[]'$ Temporary blindness   '[]'$ Dysphagia   '[]'$ Weakness or numbness in arms   '[]'$ Weakness or numbness in legs ?Musculoskeletal:  '[x]'$ Arthritis   '[]'$ Joint swelling   '[x]'$ Joint pain   '[]'$ Low back pain ?Hematologic:  '[]'$ Easy bruising  '[]'$ Easy bleeding   '[]'$ Hypercoagulable state    '[]'$ Anemic  '[]'$ Hepatitis ?Gastrointestinal:  '[]'$ Blood in stool   '[]'$ Vomiting blood  '[x]'$ Gastroesophageal reflux/heartburn   '[]'$ Abdominal pain ?Genitourinary:  '[]'$ Chronic kidney disease   '[]'$ Difficult urination  '[]'$ Frequent urination  '[]'$ Burning with urination   '[]'$ Hematuria ?Skin:  '[]'$ Rashes   '[]'$ Ulcers   '[]'$ Wounds ?Psychological:  '[]'$ History of anxiety   '[]'$  History of major depression. ? ? ? ?Physical Exam ?BP (!) 163/95 (BP Location: Left Arm)   Pulse 60   Resp 18   Ht '5\' 10"'$  (1.778 m)   Wt 228 lb (103.4 kg)   BMI 32.71 kg/m?  ?Gen:  WD/WN, NAD ?Head: [place]/AT, No temporalis wasting.  ?Ear/Nose/Throat: Hearing grossly intact, nares w/o erythema or drainage, oropharynx w/o Erythema/Exudate ?Eyes: Conjunctiva clear, sclera non-icteric  ?Neck: trachea midline.  No JVD. Left carotid bruit ?Pulmonary:  Good air movement, respirations not labored, no use of accessory muscles  ?Cardiac: RRR, no JVD ?Vascular:  ?Vessel Right Left  ?Radial Palpable Palpable  ?    ?    ?    ?    ?    ?    ?    ?    ? ?Gastrointestinal:. No masses, surgical incisions, or scars. ?Musculoskeletal: M/S 5/5 throughout.  Extremities without ischemic changes.  No deformity or atrophy. No edema. ?Neurologic: Sensation grossly intact in extremities.  Symmetrical.  Speech is fluent. Motor exam as listed above. ?Psychiatric: Judgment intact, Mood & affect appropriate for pt's clinical situation. ?Dermatologic: No rashes or ulcers noted.  No cellulitis or open wounds. ? ? ? ?Radiology ?No results found. ? ?Labs ?No results found for this or any previous visit (from the past 2160 hour(s)). ? ?Assessment/Plan: ? ?Carotid artery stenosis ?We repeated a carotid duplex today demonstrated a chronic right carotid artery occlusion with velocities that would fall in our 80 to 99% stenosis range on the left side.  This has been a significant progression in his disease over the past year and a half but now 2 duplexes would suggest this is now a high-grade lesion.  With the  contralateral occlusion, carotid stenting would be significantly preferred to avoid having to clamp the artery.  A carotid angiogram would be the first portion of this procedure, and this will determine whether or not the anatomy is acceptable for endovascular therapy.  Risks and benefits were discussed with the patient and he is agreeable to proceed. ? ?Benign essential hypertension ?blood pressure control important in reducing the progression of atherosclerotic disease. On appropriate oral  medications. ? ? ?Type II diabetes mellitus with complication (HCC) ?blood glucose control important in reducing the progression of atherosclerotic disease. Also, involved in wound healing. On appropriate medications. ? ? ?Hyperlipidemia LDL goal <100 ?lipid control important in reducing the progression of atherosclerotic disease. Continue statin therapy ? ? ? ? ? ?Leotis Pain ?10/09/2021, 10:45 AM ? ? ?This note was created with Dragon medical transcription system.  Any errors from dictation are unintentional.    ?

## 2021-10-09 NOTE — Assessment & Plan Note (Signed)
lipid control important in reducing the progression of atherosclerotic disease. Continue statin therapy  

## 2021-10-09 NOTE — Assessment & Plan Note (Signed)
blood pressure control important in reducing the progression of atherosclerotic disease. On appropriate oral medications.  

## 2021-10-09 NOTE — Assessment & Plan Note (Signed)
We repeated a carotid duplex today demonstrated a chronic right carotid artery occlusion with velocities that would fall in our 80 to 99% stenosis range on the left side.  This has been a significant progression in his disease over the past year and a half but now 2 duplexes would suggest this is now a high-grade lesion.  With the contralateral occlusion, carotid stenting would be significantly preferred to avoid having to clamp the artery.  A carotid angiogram would be the first portion of this procedure, and this will determine whether or not the anatomy is acceptable for endovascular therapy.  Risks and benefits were discussed with the patient and he is agreeable to proceed. ?

## 2021-10-09 NOTE — Assessment & Plan Note (Signed)
blood glucose control important in reducing the progression of atherosclerotic disease. Also, involved in wound healing. On appropriate medications.  

## 2021-10-11 ENCOUNTER — Telehealth (INDEPENDENT_AMBULATORY_CARE_PROVIDER_SITE_OTHER): Payer: Self-pay

## 2021-10-11 NOTE — Telephone Encounter (Signed)
Spoke with the patient and he is scheduled with Dr. Lucky Cowboy for a left carotid stent placement on 10/17/21 with a 9:30 am arrival time to the MM. Pre-procedure instructions were discussed and will be mailed. ?

## 2021-10-17 ENCOUNTER — Inpatient Hospital Stay
Admission: RE | Admit: 2021-10-17 | Discharge: 2021-10-18 | DRG: 027 | Disposition: A | Discharge status: Home / Self Care | Payer: Medicare HMO | Attending: Vascular Surgery | Admitting: Vascular Surgery

## 2021-10-17 ENCOUNTER — Encounter
Admission: RE | Disposition: A | Discharge status: Home / Self Care | Payer: Self-pay | Source: Home / Self Care | Attending: Vascular Surgery

## 2021-10-17 ENCOUNTER — Other Ambulatory Visit: Payer: Self-pay

## 2021-10-17 ENCOUNTER — Encounter: Payer: Self-pay | Admitting: Vascular Surgery

## 2021-10-17 DIAGNOSIS — E7800 Pure hypercholesterolemia, unspecified: Secondary | ICD-10-CM | POA: Diagnosis present

## 2021-10-17 DIAGNOSIS — E119 Type 2 diabetes mellitus without complications: Secondary | ICD-10-CM | POA: Diagnosis present

## 2021-10-17 DIAGNOSIS — K219 Gastro-esophageal reflux disease without esophagitis: Secondary | ICD-10-CM | POA: Diagnosis present

## 2021-10-17 DIAGNOSIS — I10 Essential (primary) hypertension: Secondary | ICD-10-CM | POA: Diagnosis present

## 2021-10-17 DIAGNOSIS — I6522 Occlusion and stenosis of left carotid artery: Secondary | ICD-10-CM | POA: Diagnosis not present

## 2021-10-17 DIAGNOSIS — Z87891 Personal history of nicotine dependence: Secondary | ICD-10-CM

## 2021-10-17 DIAGNOSIS — Z7902 Long term (current) use of antithrombotics/antiplatelets: Secondary | ICD-10-CM | POA: Diagnosis not present

## 2021-10-17 DIAGNOSIS — M109 Gout, unspecified: Secondary | ICD-10-CM | POA: Diagnosis present

## 2021-10-17 DIAGNOSIS — Z951 Presence of aortocoronary bypass graft: Secondary | ICD-10-CM | POA: Diagnosis not present

## 2021-10-17 DIAGNOSIS — Z7984 Long term (current) use of oral hypoglycemic drugs: Secondary | ICD-10-CM | POA: Diagnosis not present

## 2021-10-17 DIAGNOSIS — Z79899 Other long term (current) drug therapy: Secondary | ICD-10-CM

## 2021-10-17 DIAGNOSIS — I6523 Occlusion and stenosis of bilateral carotid arteries: Principal | ICD-10-CM | POA: Diagnosis present

## 2021-10-17 HISTORY — PX: CAROTID PTA/STENT INTERVENTION: CATH118231

## 2021-10-17 LAB — CREATININE, SERUM
Creatinine, Ser: 1.18 mg/dL (ref 0.61–1.24)
GFR, Estimated: >60 mL/min (ref >60)

## 2021-10-17 LAB — GLUCOSE, CAPILLARY
Glucose-Capillary: 115 mg/dL — ABNORMAL HIGH (ref 70–99)
Glucose-Capillary: 171 mg/dL — ABNORMAL HIGH (ref 70–99)

## 2021-10-17 LAB — BUN: BUN: 14 mg/dL (ref 8–23)

## 2021-10-17 SURGERY — CAROTID PTA/STENT INTERVENTION
Anesthesia: Moderate Sedation | Laterality: Left

## 2021-10-17 MED ORDER — ATORVASTATIN CALCIUM 20 MG PO TABS
20.0000 mg | ORAL_TABLET | Freq: Every day | ORAL | Status: DC
  Administered 2021-10-17 – 2021-10-18 (×2): 20 mg via ORAL
  Filled 2021-10-17 (×2): qty 1

## 2021-10-17 MED ORDER — ATROPINE SULFATE 1 MG/10ML IJ SOSY
PREFILLED_SYRINGE | INTRAMUSCULAR | Status: AC
  Filled 2021-10-17: qty 10

## 2021-10-17 MED ORDER — ACETAMINOPHEN 325 MG RE SUPP
325.0000 mg | RECTAL | Status: DC | PRN
  Filled 2021-10-17: qty 2

## 2021-10-17 MED ORDER — PANTOPRAZOLE SODIUM 20 MG PO TBEC
20.0000 mg | DELAYED_RELEASE_TABLET | Freq: Every day | ORAL | Status: DC
  Administered 2021-10-17 – 2021-10-18 (×2): 20 mg via ORAL
  Filled 2021-10-17 (×2): qty 1

## 2021-10-17 MED ORDER — GUAIFENESIN-DM 100-10 MG/5ML PO SYRP
15.0000 mL | ORAL_SOLUTION | ORAL | Status: DC | PRN
  Filled 2021-10-17: qty 15

## 2021-10-17 MED ORDER — ADULT MULTIVITAMIN W/MINERALS CH
1.0000 | ORAL_TABLET | Freq: Every day | ORAL | Status: DC
  Administered 2021-10-17 – 2021-10-18 (×2): 1 via ORAL
  Filled 2021-10-17 (×2): qty 1

## 2021-10-17 MED ORDER — MORPHINE SULFATE (PF) 4 MG/ML IV SOLN: 2.0000 mg | INTRAVENOUS | Status: DC | PRN

## 2021-10-17 MED ORDER — ONDANSETRON HCL 4 MG/2ML IJ SOLN: 4.0000 mg | Freq: Four times a day (QID) | INTRAMUSCULAR | Status: DC | PRN

## 2021-10-17 MED ORDER — METOPROLOL SUCCINATE ER 50 MG PO TB24
50.0000 mg | ORAL_TABLET | Freq: Every day | ORAL | Status: DC
  Administered 2021-10-17 – 2021-10-18 (×2): 50 mg via ORAL
  Filled 2021-10-17 (×2): qty 1

## 2021-10-17 MED ORDER — FENTANYL CITRATE (PF) 100 MCG/2ML IJ SOLN
INTRAMUSCULAR | Status: DC | PRN
Start: 2021-10-17 — End: 2021-10-17
  Administered 2021-10-17: 25 ug via INTRAVENOUS
  Administered 2021-10-17: 50 ug via INTRAVENOUS
  Administered 2021-10-17: 25 ug via INTRAVENOUS

## 2021-10-17 MED ORDER — MIDAZOLAM HCL 2 MG/ML PO SYRP: 8.0000 mg | ORAL_SOLUTION | Freq: Once | ORAL | Status: DC | PRN

## 2021-10-17 MED ORDER — DIPHENHYDRAMINE HCL 50 MG/ML IJ SOLN
50.0000 mg | Freq: Once | INTRAMUSCULAR | Status: DC | PRN
Start: 2021-10-17 — End: 2021-10-17

## 2021-10-17 MED ORDER — CEFAZOLIN SODIUM-DEXTROSE 2-4 GM/100ML-% IV SOLN
2.0000 g | Freq: Three times a day (TID) | INTRAVENOUS | Status: AC
  Administered 2021-10-17 – 2021-10-18 (×2): 2 g via INTRAVENOUS
  Filled 2021-10-17 (×2): qty 100

## 2021-10-17 MED ORDER — CLOPIDOGREL BISULFATE 75 MG PO TABS
75.0000 mg | ORAL_TABLET | Freq: Every day | ORAL | Status: DC
  Administered 2021-10-17 – 2021-10-18 (×2): 75 mg via ORAL
  Filled 2021-10-17 (×2): qty 1

## 2021-10-17 MED ORDER — ATROPINE SULFATE 1 MG/10ML IJ SOSY
PREFILLED_SYRINGE | INTRAMUSCULAR | Status: DC | PRN
  Administered 2021-10-17: 1 mg via INTRAVENOUS

## 2021-10-17 MED ORDER — PHENOL 1.4 % MT LIQD: 1.0000 | OROMUCOSAL | Status: DC | PRN

## 2021-10-17 MED ORDER — FAMOTIDINE IN NACL 20-0.9 MG/50ML-% IV SOLN
20.0000 mg | Freq: Two times a day (BID) | INTRAVENOUS | Status: DC
  Administered 2021-10-17 (×2): 20 mg via INTRAVENOUS
  Filled 2021-10-17 (×2): qty 50

## 2021-10-17 MED ORDER — FAMOTIDINE 20 MG PO TABS: 40.0000 mg | ORAL_TABLET | Freq: Once | ORAL | Status: DC | PRN

## 2021-10-17 MED ORDER — MIDAZOLAM HCL 2 MG/2ML IJ SOLN
INTRAMUSCULAR | Status: DC | PRN
  Administered 2021-10-17: .5 mg via INTRAVENOUS
  Administered 2021-10-17: 1 mg via INTRAVENOUS
  Administered 2021-10-17: .5 mg via INTRAVENOUS

## 2021-10-17 MED ORDER — SODIUM CHLORIDE 0.9 % IV SOLN: INTRAVENOUS | Status: DC

## 2021-10-17 MED ORDER — OXYCODONE-ACETAMINOPHEN 5-325 MG PO TABS
1.0000 | ORAL_TABLET | ORAL | Status: DC | PRN
  Administered 2021-10-17: 1 via ORAL
  Filled 2021-10-17: qty 1

## 2021-10-17 MED ORDER — METHYLPREDNISOLONE SODIUM SUCC 125 MG IJ SOLR: 125.0000 mg | Freq: Once | INTRAMUSCULAR | Status: DC | PRN

## 2021-10-17 MED ORDER — ALUM & MAG HYDROXIDE-SIMETH 200-200-20 MG/5ML PO SUSP: 15.0000 mL | ORAL | Status: DC | PRN

## 2021-10-17 MED ORDER — ASPIRIN EC 81 MG PO TBEC
81.0000 mg | DELAYED_RELEASE_TABLET | Freq: Every day | ORAL | Status: DC
  Administered 2021-10-18: 81 mg via ORAL
  Filled 2021-10-17: qty 1

## 2021-10-17 MED ORDER — LABETALOL HCL 5 MG/ML IV SOLN
10.0000 mg | INTRAVENOUS | Status: DC | PRN
  Administered 2021-10-18: 10 mg via INTRAVENOUS
  Filled 2021-10-17: qty 4

## 2021-10-17 MED ORDER — PHENYLEPHRINE 80 MCG/ML (10ML) SYRINGE FOR IV PUSH (FOR BLOOD PRESSURE SUPPORT)
PREFILLED_SYRINGE | INTRAVENOUS | Status: AC
  Filled 2021-10-17: qty 10

## 2021-10-17 MED ORDER — SODIUM CHLORIDE 0.9 % IV SOLN: 500.0000 mL | Freq: Once | INTRAVENOUS | Status: DC | PRN

## 2021-10-17 MED ORDER — CEFAZOLIN SODIUM-DEXTROSE 2-4 GM/100ML-% IV SOLN: 2.0000 g | INTRAVENOUS | Status: AC

## 2021-10-17 MED ORDER — GABAPENTIN 300 MG PO CAPS
300.0000 mg | ORAL_CAPSULE | Freq: Every day | ORAL | Status: DC
Start: 2021-10-17 — End: 2021-10-18
  Administered 2021-10-17 – 2021-10-18 (×2): 300 mg via ORAL
  Filled 2021-10-17 (×2): qty 1

## 2021-10-17 MED ORDER — POTASSIUM CHLORIDE CRYS ER 20 MEQ PO TBCR: 20.0000 meq | EXTENDED_RELEASE_TABLET | Freq: Every day | ORAL | Status: DC | PRN

## 2021-10-17 MED ORDER — HYDRALAZINE HCL 20 MG/ML IJ SOLN: 5.0000 mg | INTRAMUSCULAR | Status: DC | PRN

## 2021-10-17 MED ORDER — MAGNESIUM SULFATE 2 GM/50ML IV SOLN
2.0000 g | Freq: Every day | INTRAVENOUS | Status: DC | PRN
  Filled 2021-10-17: qty 50

## 2021-10-17 MED ORDER — ALLOPURINOL 300 MG PO TABS
300.0000 mg | ORAL_TABLET | Freq: Every day | ORAL | Status: DC
Start: 2021-10-17 — End: 2021-10-18
  Administered 2021-10-17 – 2021-10-18 (×2): 300 mg via ORAL
  Filled 2021-10-17 (×2): qty 1

## 2021-10-17 MED ORDER — HYDROMORPHONE HCL 1 MG/ML IJ SOLN: 1.0000 mg | Freq: Once | INTRAMUSCULAR | Status: DC | PRN

## 2021-10-17 MED ORDER — FENTANYL CITRATE PF 50 MCG/ML IJ SOSY
PREFILLED_SYRINGE | INTRAMUSCULAR | Status: AC
  Filled 2021-10-17: qty 1

## 2021-10-17 MED ORDER — HEPARIN SODIUM (PORCINE) 1000 UNIT/ML IJ SOLN
INTRAMUSCULAR | Status: DC | PRN
  Administered 2021-10-17: 8000 [IU] via INTRAVENOUS

## 2021-10-17 MED ORDER — ACETAMINOPHEN 325 MG PO TABS: 325.0000 mg | ORAL_TABLET | ORAL | Status: DC | PRN

## 2021-10-17 MED ORDER — SILDENAFIL CITRATE 100 MG PO TABS: 100.0000 mg | ORAL_TABLET | ORAL | Status: DC

## 2021-10-17 MED ORDER — METOPROLOL TARTRATE 5 MG/5ML IV SOLN: 2.0000 mg | INTRAVENOUS | Status: DC | PRN

## 2021-10-17 MED ORDER — CEFAZOLIN SODIUM-DEXTROSE 2-4 GM/100ML-% IV SOLN
INTRAVENOUS | Status: AC
  Administered 2021-10-17: 2 g via INTRAVENOUS
  Filled 2021-10-17: qty 100

## 2021-10-17 MED ORDER — IODIXANOL 320 MG/ML IV SOLN
INTRAVENOUS | Status: DC | PRN
  Administered 2021-10-17: 70 mL via INTRA_ARTERIAL

## 2021-10-17 SURGICAL SUPPLY — 22 items
BALLN VIATRAC 5X30X135 (BALLOONS) ×2
BALLOON VIATRAC 5X30X135 (BALLOONS) IMPLANT
CATH ANGIO 5F PIGTAIL 100CM (CATHETERS) ×1 IMPLANT
CATH BEACON 5 .035 100 JB2 TIP (CATHETERS) ×1 IMPLANT
COVER DRAPE FLUORO 36X44 (DRAPES) ×1 IMPLANT
COVER PROBE U/S 5X48 (MISCELLANEOUS) ×1 IMPLANT
DEVICE EMBOSHIELD NAV6 4.0-7.0 (FILTER) ×1 IMPLANT
DEVICE SAFEGUARD 24CM (GAUZE/BANDAGES/DRESSINGS) ×1 IMPLANT
DEVICE STARCLOSE SE CLOSURE (Vascular Products) ×1 IMPLANT
DEVICE TORQUE (MISCELLANEOUS) ×1 IMPLANT
GLIDEWIRE ANGLED SS 035X260CM (WIRE) ×1 IMPLANT
GUIDEWIRE VASC STIFF .038X260 (WIRE) ×1 IMPLANT
KIT CAROTID MANIFOLD (MISCELLANEOUS) ×1 IMPLANT
KIT ENCORE 26 ADVANTAGE (KITS) ×1 IMPLANT
PACK ANGIOGRAPHY (CUSTOM PROCEDURE TRAY) ×2 IMPLANT
SHEATH BRITE TIP 5FRX11 (SHEATH) ×1 IMPLANT
SHEATH NEURON MAX 6FR 90CM (SHEATH) ×1 IMPLANT
SHEATH SHUTTLE 6FR (SHEATH) ×1 IMPLANT
STENT XACT CAR 9-7X40X136 (Permanent Stent) ×1 IMPLANT
SYR MEDRAD MARK 7 150ML (SYRINGE) ×1 IMPLANT
TUBING CONTRAST HIGH PRESS 72 (TUBING) ×1 IMPLANT
WIRE GUIDERIGHT .035X150 (WIRE) ×1 IMPLANT

## 2021-10-17 NOTE — OR Nursing (Signed)
Squeezed ball in right hand on command ?

## 2021-10-17 NOTE — Op Note (Signed)
? ? ?OPERATIVE NOTE ?DATE: 10/17/2021 ? ?PROCEDURE: ? Ultrasound guidance for vascular access right femoral artery ? Placement of a 9 mm proximal, 7 mm distal, 4 cm long exact stent with the use of the NAV-6 embolic protection device in the left carotid artery ? ?PRE-OPERATIVE DIAGNOSIS: 1.  Greater than 80% left carotid artery stenosis. ?2.  Right carotid occlusion ? ?POST-OPERATIVE DIAGNOSIS:  Same as above ? ?SURGEON: Leotis Pain, MD ? ?ASSISTANT(S): None ? ?ANESTHESIA: local/MCS ? ?ESTIMATED BLOOD LOSS: 15 cc ? ?CONTRAST: 70 cc ? ?FLUORO TIME: 5.1 minutes ? ?MODERATE CONSCIOUS SEDATION TIME:  Approximately 35 minutes using 2 mg of Versed and 100 mcg of Fentanyl ? ?FINDING(S): ?1.   80 to 85% left distal common carotid artery stenosis, right carotid occlusion ? ?SPECIMEN(S):   none ? ?INDICATIONS:   ?Patient is a 76 y.o. male who presents with high-grade left carotid artery stenosis.  The patient has a known right carotid occlusion and carotid artery stenting was felt to be preferred to endarterectomy for that reason.  Risks and benefits were discussed and informed consent was obtained.  ? ?DESCRIPTION: ?After obtaining full informed written consent, the patient was brought back to the vascular suite and placed supine upon the table.  The patient received IV antibiotics prior to induction. Moderate conscious sedation was administered during a face to face encounter with the patient throughout the procedure with my supervision of the RN administering medicines and monitoring the patients vital signs and mental status throughout from the start of the procedure until the patient was taken to the recovery room.  After obtaining adequate anesthesia, the patient was prepped and draped in the standard fashion.   The right femoral artery was visualized with ultrasound and found to be widely patent. It was then accessed under direct ultrasound guidance without difficulty with a Seldinger needle. A permanent image was  recorded. A J-wire was placed and we then placed a 6 French sheath. The patient was then heparinized and a total of 8000 units of intravenous heparin were given and an ACT was checked to confirm successful anticoagulation. A pigtail catheter was then placed into the ascending aorta. This showed a type II aortic arch with a very reverse curve of the left common carotid artery.  There did appear to be a right carotid occlusion with poor cervical filling of the right carotid artery on the original aortogram. I then selectively cannulated the left carotid artery without difficulty with a JB2 catheter and advanced into the mid left common carotid artery.  Cervical and cerebral carotid angiography was then performed. There were no obvious intracranial filling defects with complete cross-filling left to right. The carotid bifurcation demonstrated an 80 to 85% mild to moderately calcific stenosis of the distal common carotid artery spilling into the proximal left internal carotid artery.  I then advanced into the distal common carotid artery with a Glidewire and the JB2 catheter and then exchanged for the Amplatz Super Stiff wire taking care not to cross the lesion while placing the sheath. Over the Amplatz Super Stiff wire, a 6 [place] shuttle sheath was placed into the mid common carotid artery. I then used the NAV-6  Embolic protection device and crossed the lesion and parked this in the distal internal carotid artery at the base of the skull.  I then selected a 9 mm proximal, 7 mm distal, 4 cm long exact stent. This was deployed across the lesion encompassing it in its entirety. A 5 mm diameter by 3  cm length balloon was used to post dilate the stent. Only about a 10-15% residual stenosis was present after angioplasty. Completion angiogram showed normal intracranial filling without new defects. At this point I elected to terminate the procedure. The sheath was removed and StarClose closure device was deployed in the  right femoral artery with excellent hemostatic result. The patient was taken to the recovery room in stable condition having tolerated the procedure well. ? ?COMPLICATIONS: none ? ?CONDITION: stable ? ?Leotis Pain ?10/17/2021 ?11:32 AM ? ? ?This note was created with Dragon Medical transcription system. Any errors in dictation are purely unintentional.  ?

## 2021-10-17 NOTE — Interval H&P Note (Signed)
History and Physical Interval Note: ? ?10/17/2021 ?9:10 AM ? ?Brendan Callahan  has presented today for surgery, with the diagnosis of L Carotid Stent   [place]   Carotid artery stenosis.  The various methods of treatment have been discussed with the patient and family. After consideration of risks, benefits and other options for treatment, the patient has consented to  Procedure(s): ?CAROTID PTA/STENT INTERVENTION (Left) as a surgical intervention.  The patient's history has been reviewed, patient examined, no change in status, stable for surgery.  I have reviewed the patient's chart and labs.  Questions were answered to the patient's satisfaction.   ? ? ?Leotis Pain ? ? ?

## 2021-10-18 DIAGNOSIS — I6522 Occlusion and stenosis of left carotid artery: Secondary | ICD-10-CM

## 2021-10-18 LAB — CBC
HCT: 42.6 % (ref 39.0–52.0)
Hemoglobin: 14.2 g/dL (ref 13.0–17.0)
MCH: 30.3 pg (ref 26.0–34.0)
MCHC: 33.3 g/dL (ref 30.0–36.0)
MCV: 90.8 fL (ref 80.0–100.0)
Platelets: 164 10*3/uL (ref 150–400)
RBC: 4.69 MIL/uL (ref 4.22–5.81)
RDW: 14.8 % (ref 11.5–15.5)
WBC: 6.7 10*3/uL (ref 4.0–10.5)
nRBC: 0 % (ref 0.0–0.2)

## 2021-10-18 LAB — BASIC METABOLIC PANEL
Anion gap: 4 — ABNORMAL LOW (ref 5–15)
BUN: 15 mg/dL (ref 8–23)
CO2: 27 mmol/L (ref 22–32)
Calcium: 8.4 mg/dL — ABNORMAL LOW (ref 8.9–10.3)
Chloride: 109 mmol/L (ref 98–111)
Creatinine, Ser: 1.19 mg/dL (ref 0.61–1.24)
GFR, Estimated: >60 mL/min (ref >60)
Glucose, Bld: 144 mg/dL — ABNORMAL HIGH (ref 70–99)
Potassium: 4 mmol/L (ref 3.5–5.1)
Sodium: 140 mmol/L (ref 135–145)

## 2021-10-18 LAB — POCT ACTIVATED CLOTTING TIME: Activated Clotting Time: 299 seconds

## 2021-10-18 MED ORDER — OXYCODONE-ACETAMINOPHEN 5-325 MG PO TABS: 1.0000 | ORAL_TABLET | Freq: Four times a day (QID) | ORAL | 0 refills | Status: DC | PRN

## 2021-10-18 MED ORDER — ASPIRIN 81 MG PO TBEC: 81.0000 mg | DELAYED_RELEASE_TABLET | Freq: Every day | ORAL | 11 refills | Status: DC

## 2021-10-18 MED ORDER — CHLORHEXIDINE GLUCONATE CLOTH 2 % EX PADS
6.0000 | MEDICATED_PAD | Freq: Every day | CUTANEOUS | Status: DC
  Administered 2021-10-18: 6 via TOPICAL

## 2021-10-18 NOTE — Discharge Summary (Signed)
?[facility]    ?Discharge Summary ? ? ? ?Patient ID:  ?Brendan Callahan ?MRN: [id number] ?DOB/AGE: 1946-04-17 76 y.o. ? ?Admit date: 10/17/2021 ?Discharge date: 10/18/2021 ?Date of Surgery: 10/17/2021 ?Surgeon: Surgeon(s): ?Algernon Huxley, MD ? ?Admission Diagnosis: ?Carotid stenosis, left [I65.22] ? ?Discharge Diagnoses:  ?Carotid stenosis, left [I65.22] ? ?Secondary Diagnoses: ?Past Medical History:  ?Diagnosis Date  ? Acute diverticulitis 12/02/2017  ? GERD (gastroesophageal reflux disease)   ? Gout   ? "on daily RX" (12/02/2017)  ? High cholesterol   ? History of kidney stones   ? Hypertension   ? Type II diabetes mellitus ([place])   ? ? ?Procedure(s): ?CAROTID PTA/STENT INTERVENTION ? ?Discharged Condition: good ? ?HPI:  ?Brendan Callahan is a 76 year old male that presented to [facility] on 10/17/2021 for left carotid stent placement.  Prior to intervention the patient had an 80 to 85% distal left common carotid artery stenosis with a right carotid artery occlusion.  Today he has no focal neurological deficits.  He has no issues with blood pressure, headache or bleeding of his wound site.  Overall the patient notes that he feels very well and is ready to go home. ? ?Hospital Course:  ?Brendan Callahan is a 76 y.o. male is S/P Left Carotid Stent Placement  ?Procedure(s): ?CAROTID PTA/STENT INTERVENTION ?Extubated: POD # 0 ?Physical exam: No focal neurological symptoms, 1+ pedal pulses ?Post-op wounds clean, dry, intact or healing well ?Pt. Ambulating, voiding and taking PO diet without difficulty. ?Pt pain controlled with PO pain meds. ?Labs as below ?Complications:none ? ?Consults:  ? ? ?Significant Diagnostic Studies: ?CBC ?Lab Results  ?Component Value Date  ? WBC 6.7 10/18/2021  ? HGB 14.2 10/18/2021  ? HCT 42.6 10/18/2021  ? MCV 90.8 10/18/2021  ? PLT 164 10/18/2021  ? ? ?BMET ?   ?Component Value Date/Time  ? NA 140 10/18/2021 0431  ? K 4.0 10/18/2021 0431  ? CL 109  10/18/2021 0431  ? CO2 27 10/18/2021 0431  ? GLUCOSE 144 (H) 10/18/2021 0431  ? BUN 15 10/18/2021 0431  ? CREATININE 1.19 10/18/2021 0431  ? CALCIUM 8.4 (L) 10/18/2021 0431  ? GFRNONAA >60 10/18/2021 0431  ? GFRAA >60 12/04/2017 0659  ? ?COAG ?No results found for: INR, PROTIME ? ? ?Disposition:  ?Discharge to :Home ? ?Allergies as of 10/18/2021   ?No Known Allergies ?  ? ?  ?Medication List  ?  ? ?TAKE these medications   ? ?allopurinol 300 MG tablet ?Commonly known as: ZYLOPRIM ?Take 300 mg by mouth daily. ?  ?aspirin 81 MG EC tablet ?Take 1 tablet (81 mg total) by mouth daily at 6 (six) AM. Swallow whole. ?Start taking on: Oct 19, 2021 ?  ?atorvastatin 20 MG tablet ?Commonly known as: LIPITOR ?Take 20 mg by mouth daily. ?  ?clopidogrel 75 MG tablet ?Commonly known as: PLAVIX ?Take 75 mg by mouth daily. ?  ?gabapentin 300 MG capsule ?Commonly known as: NEURONTIN ?Take 300 mg by mouth daily. ?  ?metFORMIN 500 MG tablet ?Commonly known as: GLUCOPHAGE ?Take 500 mg by mouth daily. ?  ?metoprolol succinate 50 MG 24 hr tablet ?Commonly known as: TOPROL-XL ?Take 50 mg by mouth daily. ?  ?multivitamin with minerals Tabs tablet ?Take 1 tablet by mouth daily. ?  ?oxyCODONE-acetaminophen 5-325 MG tablet ?Commonly known as: PERCOCET/ROXICET ?Take 1 tablet by mouth every 6 (six) hours as needed for moderate pain. ?  ?pantoprazole 20 MG tablet ?Commonly known as: PROTONIX ?Take 20  mg by mouth daily. ?  ?sildenafil 100 MG tablet ?Commonly known as: VIAGRA ?Take 100 mg by mouth as directed. ?  ?True Metrix Blood Glucose Test test strip ?Generic drug: glucose blood ?SMARTSIG:Via Meter ?  ?TRUEplus Lancets 33G Misc ?  ? ?  ? ?Verbal and written Discharge instructions given to the patient. Wound care per Discharge AVS ? Follow-up Information   ? ? Kris Hartmann, NP Follow up in 3 week(s).   ?Specialty: Vascular Surgery ?Why: See FB/JD...with carotid in 3 weeks ?Contact information: ?[address] ?[place]  [address] ?[phone] ? ? ?  ?  ? ?  ?  ? ?  ? ? ?Signed: ?Kris Hartmann, NP ? ?10/18/2021, 9:35 AM ? ?  ? ?

## 2021-10-18 NOTE — Progress Notes (Signed)
Pt. Is to be discharged to home with self care, he will be staying with family for a few days for assistance. Pt. Has been educated on meds/restrictions and verbalizes understanding. ?V/S are stable and all belongings given to him. ?He is aware of need for follow up appointment. Used wheel chair to awaiting ride with a friend. ? ? ? ? ? ?

## 2021-11-15 ENCOUNTER — Ambulatory Visit (INDEPENDENT_AMBULATORY_CARE_PROVIDER_SITE_OTHER): Payer: Medicare HMO | Admitting: Nurse Practitioner

## 2021-11-15 ENCOUNTER — Encounter (INDEPENDENT_AMBULATORY_CARE_PROVIDER_SITE_OTHER): Payer: Medicare HMO

## 2021-12-20 ENCOUNTER — Other Ambulatory Visit (INDEPENDENT_AMBULATORY_CARE_PROVIDER_SITE_OTHER): Payer: Self-pay | Admitting: Vascular Surgery

## 2021-12-20 DIAGNOSIS — I6522 Occlusion and stenosis of left carotid artery: Secondary | ICD-10-CM

## 2021-12-23 ENCOUNTER — Encounter (INDEPENDENT_AMBULATORY_CARE_PROVIDER_SITE_OTHER): Payer: Self-pay | Admitting: Nurse Practitioner

## 2021-12-23 ENCOUNTER — Ambulatory Visit (INDEPENDENT_AMBULATORY_CARE_PROVIDER_SITE_OTHER): Payer: Medicare HMO

## 2021-12-23 ENCOUNTER — Ambulatory Visit (INDEPENDENT_AMBULATORY_CARE_PROVIDER_SITE_OTHER): Payer: Medicare HMO | Admitting: Nurse Practitioner

## 2021-12-23 VITALS — BP 153/77 | HR 58 | Resp 17 | Ht 70.0 in | Wt 227.0 lb

## 2021-12-23 DIAGNOSIS — I6522 Occlusion and stenosis of left carotid artery: Secondary | ICD-10-CM | POA: Diagnosis not present

## 2021-12-24 ENCOUNTER — Encounter (INDEPENDENT_AMBULATORY_CARE_PROVIDER_SITE_OTHER): Payer: Self-pay | Admitting: Nurse Practitioner

## 2021-12-24 NOTE — Progress Notes (Signed)
Subjective:    Patient ID: Brendan Callahan, male    DOB: Jan 16, 1946, 76 y.o.   MRN: [id number] No chief complaint on file.   The patient is seen for follow up evaluation of carotid stenosis status post righ carotid endarterectomy on 10/08/2021.  There were no post operative problems or complications related to the surgery.  The patient denies neck or incisional pain.  He notes that the previous bruit he heard resting his colon.  The patient denies interval amaurosis fugax. There is no recent history of TIA symptoms or focal motor deficits. There is no prior documented CVA.  The patient denies headache.  The patient is taking enteric-coated aspirin 81 mg daily.  No recent shortening of the patient's walking distance or new symptoms consistent with claudication.  No history of rest pain symptoms. No new ulcers or wounds of the lower extremities have occurred.  There is no history of DVT, PE or superficial thrombophlebitis. No recent episodes of angina or shortness of breath documented.       Review of Systems  All other systems reviewed and are negative.      Objective:   Physical Exam Vitals reviewed.  HENT:     Head: Normocephalic.  Neck:     Vascular: Carotid bruit present.  Cardiovascular:     Rate and Rhythm: Normal rate.     Pulses: Normal pulses.  Pulmonary:     Effort: Pulmonary effort is normal.  Skin:    General: Skin is warm and dry.  Neurological:     Mental Status: He is alert and oriented to person, place, and time.  Psychiatric:        Mood and Affect: Mood normal.        Behavior: Behavior normal.        Thought Content: Thought content normal.        Judgment: Judgment normal.     BP (!) 153/77 (BP Location: Right Arm)   Pulse (!) 58   Resp 17   Ht '5\' 10"'$  (1.778 m)   Wt 227 lb (103 kg)   BMI 32.57 kg/m   Past Medical History:  Diagnosis Date   Acute diverticulitis 12/02/2017   GERD (gastroesophageal reflux disease)    Gout    "on daily  RX" (12/02/2017)   High cholesterol    History of kidney stones    Hypertension    Type II diabetes mellitus ([place])     Social History   Socioeconomic History   Marital status: Divorced    Spouse name: Not on file   Number of children: Not on file   Years of education: Not on file   Highest education level: Not on file  Occupational History   Not on file  Tobacco Use   Smoking status: Former    Packs/day: 1.00    Years: 20.00    Total pack years: 20.00    Types: Cigarettes    Quit date: 06/09/1978    Years since quitting: 43.5   Smokeless tobacco: Never   Tobacco comments:    12/02/2017 "nothing since 1980s"  Vaping Use   Vaping Use: Never used  Substance and Sexual Activity   Alcohol use: Yes    Comment: 12/02/2017 "couple beers/month"   Drug use: Never   Sexual activity: Not Currently  Other Topics Concern   Not on file  Social History Narrative   Not on file   Social Determinants of Health   Financial Resource Strain: Not on file  Food Insecurity: Not on file  Transportation Needs: Not on file  Physical Activity: Not on file  Stress: Not on file  Social Connections: Not on file  Intimate Partner Violence: Not on file    Past Surgical History:  Procedure Laterality Date   BLADDER STONE REMOVAL  ~ 2004   CAROTID PTA/STENT INTERVENTION Left 10/17/2021   Procedure: CAROTID PTA/STENT INTERVENTION;  Surgeon: Algernon Huxley, MD;  Location: [facility];  Service: Cardiovascular;  Laterality: Left;   CORONARY ARTERY BYPASS GRAFT  2001   CABG X3"   TONSILLECTOMY      Family History  Problem Relation Age of Onset   Hernia Mother    Healthy Father     No Known Allergies     Latest Ref Rng & Units 10/18/2021    4:31 AM 07/13/2020    8:27 PM 12/04/2017    6:59 AM  CBC  WBC 4.0 - 10.5 K/uL 6.7  8.0  5.8   Hemoglobin 13.0 - 17.0 g/dL 14.2  15.2  14.4   Hematocrit 39.0 - 52.0 % 42.6  44.3  44.2   Platelets 150 - 400 K/uL 164  189  150       CMP      Component Value Date/Time   NA 140 10/18/2021 0431   K 4.0 10/18/2021 0431   CL 109 10/18/2021 0431   CO2 27 10/18/2021 0431   GLUCOSE 144 (H) 10/18/2021 0431   BUN 15 10/18/2021 0431   CREATININE 1.19 10/18/2021 0431   CALCIUM 8.4 (L) 10/18/2021 0431   PROT 7.6 07/13/2020 2027   ALBUMIN 4.1 07/13/2020 2027   AST 32 07/13/2020 2027   ALT 23 07/13/2020 2027   ALKPHOS 96 07/13/2020 2027   BILITOT 0.6 07/13/2020 2027   GFRNONAA >60 10/18/2021 0431   GFRAA >60 12/04/2017 0659     No results found.     Assessment & Plan:   1. Carotid stenosis, left Recommend:  The patient is s/p successful carotid stent  Duplex ultrasound preoperatively shows a known occluded right ICA contralateral stenosis.  Continue antiplatelet therapy as prescribed Continue management of CAD, HTN and Hyperlipidemia Healthy heart diet,  encouraged exercise at least 4 times per week  Follow up in 3 months with duplex ultrasound and physical exam based on the patient's carotid surgery   Current Outpatient Medications on File Prior to Visit  Medication Sig Dispense Refill   allopurinol (ZYLOPRIM) 300 MG tablet Take 300 mg by mouth daily.     aspirin EC 81 MG EC tablet Take 1 tablet (81 mg total) by mouth daily at 6 (six) AM. Swallow whole. 30 tablet 11   atorvastatin (LIPITOR) 20 MG tablet Take 20 mg by mouth daily.     clopidogrel (PLAVIX) 75 MG tablet Take 75 mg by mouth daily.     gabapentin (NEURONTIN) 300 MG capsule Take 300 mg by mouth daily.     hydrocortisone 2.5 % lotion Apply topically daily.     meclizine (ANTIVERT) 25 MG tablet Take 25 mg by mouth 2 (two) times daily as needed.     metFORMIN (GLUCOPHAGE) 500 MG tablet Take 500 mg by mouth daily.     metoprolol succinate (TOPROL-XL) 50 MG 24 hr tablet Take 50 mg by mouth daily.     Multiple Vitamin (MULTIVITAMIN WITH MINERALS) TABS tablet Take 1 tablet by mouth daily.     oxyCODONE-acetaminophen (PERCOCET/ROXICET) 5-325 MG tablet Take 1  tablet by mouth every 6 (six) hours  as needed for moderate pain. 20 tablet 0   pantoprazole (PROTONIX) 20 MG tablet Take 20 mg by mouth daily.     sildenafil (VIAGRA) 100 MG tablet Take 100 mg by mouth as directed.     TRUE METRIX BLOOD GLUCOSE TEST test strip SMARTSIG:Via Meter     TRUEplus Lancets 33G MISC      No current facility-administered medications on file prior to visit.    There are no Patient Instructions on file for this visit. No follow-ups on file.   Kris Hartmann, NP

## 2022-02-25 ENCOUNTER — Encounter: Payer: Self-pay | Admitting: Ophthalmology

## 2022-02-27 NOTE — Discharge Instructions (Signed)

## 2022-02-28 NOTE — Anesthesia Preprocedure Evaluation (Signed)
Anesthesia Evaluation  Patient identified by MRN, date of birth, ID band Patient awake    Reviewed: Allergy & Precautions, NPO status , Patient's Chart, lab work & pertinent test results  History of Anesthesia Complications Negative for: history of anesthetic complications  Airway Mallampati: III  TM Distance: >3 FB Neck ROM: full    Dental  (+) Upper Dentures, Lower Dentures   Pulmonary neg pulmonary ROS, former smoker,    Pulmonary exam normal        Cardiovascular hypertension, + CAD, + CABG (2001) and + Peripheral Vascular Disease  Normal cardiovascular exam  Carotid stent 10/2021   Neuro/Psych Neuropathy of both feet TIAnegative psych ROS   GI/Hepatic Neg liver ROS, GERD  ,  Endo/Other  diabetes, Type 2  Renal/GU      Musculoskeletal   Abdominal   Peds  Hematology negative hematology ROS (+)   Anesthesia Other Findings Past Medical History: 12/02/2017: Acute diverticulitis No date: GERD (gastroesophageal reflux disease) No date: Gout     Comment:  "on daily RX" (12/02/2017) No date: High cholesterol No date: History of kidney stones No date: Hypertension No date: Type II diabetes mellitus ([place]) No date: Vertigo     Comment:  none in over 1 year No date: Wears dentures     Comment:  full upper and lower  Past Surgical History: ~ 2004: BLADDER STONE REMOVAL 10/17/2021: CAROTID PTA/STENT INTERVENTION; Left     Comment:  Procedure: CAROTID PTA/STENT INTERVENTION;  Surgeon:               Algernon Huxley, MD;  Location: [facility];                Service: Cardiovascular;  Laterality: Left; 2001: CORONARY ARTERY BYPASS GRAFT     Comment:  CABG X3" No date: TONSILLECTOMY  BMI    Body Mass Index: 30.13 kg/m      Reproductive/Obstetrics negative OB ROS                             Anesthesia Physical Anesthesia Plan  ASA: 3  Anesthesia Plan: MAC   Post-op Pain  Management:    Induction:   PONV Risk Score and Plan:   Airway Management Planned:   Additional Equipment:   Intra-op Plan:   Post-operative Plan:   Informed Consent: I have reviewed the patients History and Physical, chart, labs and discussed the procedure including the risks, benefits and alternatives for the proposed anesthesia with the patient or authorized representative who has indicated his/her understanding and acceptance.       Plan Discussed with: Anesthesiologist, CRNA and Surgeon  Anesthesia Plan Comments:        Anesthesia Quick Evaluation

## 2022-03-04 ENCOUNTER — Ambulatory Visit (AMBULATORY_SURGERY_CENTER): Payer: Medicare HMO | Admitting: Anesthesiology

## 2022-03-04 ENCOUNTER — Ambulatory Visit: Payer: Medicare HMO | Admitting: Anesthesiology

## 2022-03-04 ENCOUNTER — Encounter: Payer: Self-pay | Admitting: Ophthalmology

## 2022-03-04 ENCOUNTER — Ambulatory Visit
Admission: RE | Admit: 2022-03-04 | Discharge: 2022-03-04 | Disposition: A | Discharge status: Home / Self Care | Payer: Medicare HMO | Attending: Ophthalmology | Admitting: Ophthalmology

## 2022-03-04 ENCOUNTER — Encounter
Admission: RE | Disposition: A | Discharge status: Home / Self Care | Payer: Self-pay | Source: Home / Self Care | Attending: Ophthalmology

## 2022-03-04 ENCOUNTER — Other Ambulatory Visit: Payer: Self-pay

## 2022-03-04 DIAGNOSIS — E1151 Type 2 diabetes mellitus with diabetic peripheral angiopathy without gangrene: Secondary | ICD-10-CM | POA: Insufficient documentation

## 2022-03-04 DIAGNOSIS — G459 Transient cerebral ischemic attack, unspecified: Secondary | ICD-10-CM | POA: Diagnosis not present

## 2022-03-04 DIAGNOSIS — H2512 Age-related nuclear cataract, left eye: Secondary | ICD-10-CM | POA: Insufficient documentation

## 2022-03-04 DIAGNOSIS — Z87891 Personal history of nicotine dependence: Secondary | ICD-10-CM

## 2022-03-04 DIAGNOSIS — E1136 Type 2 diabetes mellitus with diabetic cataract: Secondary | ICD-10-CM | POA: Diagnosis not present

## 2022-03-04 DIAGNOSIS — I2510 Atherosclerotic heart disease of native coronary artery without angina pectoris: Secondary | ICD-10-CM

## 2022-03-04 DIAGNOSIS — Z951 Presence of aortocoronary bypass graft: Secondary | ICD-10-CM | POA: Insufficient documentation

## 2022-03-04 DIAGNOSIS — K219 Gastro-esophageal reflux disease without esophagitis: Secondary | ICD-10-CM | POA: Diagnosis not present

## 2022-03-04 DIAGNOSIS — E1142 Type 2 diabetes mellitus with diabetic polyneuropathy: Secondary | ICD-10-CM | POA: Diagnosis not present

## 2022-03-04 DIAGNOSIS — I10 Essential (primary) hypertension: Secondary | ICD-10-CM | POA: Diagnosis not present

## 2022-03-04 DIAGNOSIS — Z7984 Long term (current) use of oral hypoglycemic drugs: Secondary | ICD-10-CM | POA: Diagnosis not present

## 2022-03-04 HISTORY — DX: Dizziness and giddiness: R42

## 2022-03-04 HISTORY — PX: CATARACT EXTRACTION W/PHACO: SHX586

## 2022-03-04 HISTORY — DX: Presence of dental prosthetic device (complete) (partial): Z97.2

## 2022-03-04 LAB — GLUCOSE, CAPILLARY
Glucose-Capillary: 105 mg/dL — ABNORMAL HIGH (ref 70–99)
Glucose-Capillary: 107 mg/dL — ABNORMAL HIGH (ref 70–99)

## 2022-03-04 SURGERY — PHACOEMULSIFICATION, CATARACT, WITH IOL INSERTION
Anesthesia: Monitor Anesthesia Care | Site: Eye | Laterality: Left

## 2022-03-04 MED ORDER — ARMC OPHTHALMIC DILATING DROPS
1.0000 | OPHTHALMIC | Status: DC | PRN
  Administered 2022-03-04 (×3): 1 via OPHTHALMIC

## 2022-03-04 MED ORDER — FENTANYL CITRATE (PF) 100 MCG/2ML IJ SOLN
INTRAMUSCULAR | Status: DC | PRN
  Administered 2022-03-04: 100 ug via INTRAVENOUS

## 2022-03-04 MED ORDER — SIGHTPATH DOSE#1 BSS IO SOLN
INTRAOCULAR | Status: DC | PRN
  Administered 2022-03-04: 75 mL via OPHTHALMIC

## 2022-03-04 MED ORDER — SIGHTPATH DOSE#1 NA CHONDROIT SULF-NA HYALURON 40-17 MG/ML IO SOLN
INTRAOCULAR | Status: DC | PRN
  Administered 2022-03-04: 1 mL via INTRAOCULAR

## 2022-03-04 MED ORDER — TETRACAINE HCL 0.5 % OP SOLN
1.0000 [drp] | OPHTHALMIC | Status: DC | PRN
Start: 2022-03-04 — End: 2022-03-04
  Administered 2022-03-04 (×3): 1 [drp] via OPHTHALMIC

## 2022-03-04 MED ORDER — MIDAZOLAM HCL 2 MG/2ML IJ SOLN
INTRAMUSCULAR | Status: DC | PRN
  Administered 2022-03-04: 2 mg via INTRAVENOUS

## 2022-03-04 MED ORDER — BRIMONIDINE TARTRATE-TIMOLOL 0.2-0.5 % OP SOLN
OPHTHALMIC | Status: DC | PRN
  Administered 2022-03-04: 1 [drp] via OPHTHALMIC

## 2022-03-04 MED ORDER — SIGHTPATH DOSE#1 BSS IO SOLN
INTRAOCULAR | Status: DC | PRN
  Administered 2022-03-04: 15 mL

## 2022-03-04 MED ORDER — SIGHTPATH DOSE#1 BSS IO SOLN
INTRAOCULAR | Status: DC | PRN
  Administered 2022-03-04: 1 mL via INTRAMUSCULAR

## 2022-03-04 MED ORDER — MOXIFLOXACIN HCL 0.5 % OP SOLN
OPHTHALMIC | Status: DC | PRN
  Administered 2022-03-04: 0.2 mL via OPHTHALMIC

## 2022-03-04 SURGICAL SUPPLY — 9 items
CATARACT SUITE SIGHTPATH (MISCELLANEOUS) ×1 IMPLANT
FEE CATARACT SUITE SIGHTPATH (MISCELLANEOUS) ×1 IMPLANT
GLOVE SURG ENC TEXT LTX SZ8 (GLOVE) ×1 IMPLANT
GLOVE SURG TRIUMPH 8.0 PF LTX (GLOVE) ×1 IMPLANT
LENS IOL TECNIS EYHANCE 22.0 (Intraocular Lens) IMPLANT
NDL FILTER BLUNT 18X1 1/2 (NEEDLE) ×1 IMPLANT
NEEDLE FILTER BLUNT 18X1 1/2 (NEEDLE) ×1 IMPLANT
SYR 3ML LL SCALE MARK (SYRINGE) ×1 IMPLANT
WATER STERILE IRR 250ML POUR (IV SOLUTION) ×1 IMPLANT

## 2022-03-04 NOTE — Op Note (Signed)
PREOPERATIVE DIAGNOSIS:  Nuclear sclerotic cataract of the left eye.   POSTOPERATIVE DIAGNOSIS:  Nuclear sclerotic cataract of the left eye.   OPERATIVE PROCEDURE:ORPROCALL@   SURGEON:  Brendan Robson, MD.   ANESTHESIA:  Anesthesiologist: Iran Ouch, MD CRNA: Tobie Poet, CRNA  1.      Managed anesthesia care. 2.     0.83m of Shugarcaine was instilled following the paracentesis   COMPLICATIONS:  None.   TECHNIQUE:   Stop and chop   DESCRIPTION OF PROCEDURE:  The patient was examined and consented in the preoperative holding area where the aforementioned topical anesthesia was applied to the left eye and then brought back to the Operating Room where the left eye was prepped and draped in the usual sterile ophthalmic fashion and a lid speculum was placed. A paracentesis was created with the side port blade and the anterior chamber was filled with viscoelastic. A near clear corneal incision was performed with the steel keratome. A continuous curvilinear capsulorrhexis was performed with a cystotome followed by the capsulorrhexis forceps. Hydrodissection and hydrodelineation were carried out with BSS on a blunt cannula. The lens was removed in a stop and chop  technique and the remaining cortical material was removed with the irrigation-aspiration handpiece. The capsular bag was inflated with viscoelastic and the Technis [id number] lens was placed in the capsular bag without complication. The remaining viscoelastic was removed from the eye with the irrigation-aspiration handpiece. The wounds were hydrated. The anterior chamber was flushed with BSS and the eye was inflated to physiologic pressure. 0.180mVigamox was placed in the anterior chamber. The wounds were found to be water tight. The eye was dressed with Combigan. The patient was given protective glasses to wear throughout the day and a shield with which to sleep tonight. The patient was also given drops with which to begin a  drop regimen today and will follow-up with me in one day. Implant Name Type Inv. Item Serial No. Manufacturer Lot No. LRB No. Used Action  LENS IOL TECNIS EYHANCE 22.0 - [id number] Lens LENS IOL TECNIS EYHANCE 22.0 [id number]  Left 1 Implanted    Procedure(s) with comments: CATARACT EXTRACTION PHACO AND INTRAOCULAR LENS PLACEMENT (IOC) LEFT 9.82 01:02.2 (Left) - Diabetic  Electronically signed: WiBirder Callahan/26/2023 10:14 AM

## 2022-03-04 NOTE — Transfer of Care (Signed)
Immediate Anesthesia Transfer of Care Note  Patient: Brendan Callahan  Procedure(s) Performed: CATARACT EXTRACTION PHACO AND INTRAOCULAR LENS PLACEMENT (IOC) LEFT 9.82 01:02.2 (Left: Eye)  Patient Location: PACU  Anesthesia Type: MAC  Level of Consciousness: awake, alert  and patient cooperative  Airway and Oxygen Therapy: Patient Spontanous Breathing and Patient connected to supplemental oxygen  Post-op Assessment: Post-op Vital signs reviewed, Patient's Cardiovascular Status Stable, Respiratory Function Stable, Patent Airway and No signs of Nausea or vomiting  Post-op Vital Signs: Reviewed and stable  Complications: No notable events documented.

## 2022-03-04 NOTE — Anesthesia Postprocedure Evaluation (Signed)
Anesthesia Post Note  Patient: Brendan Callahan  Procedure(s) Performed: CATARACT EXTRACTION PHACO AND INTRAOCULAR LENS PLACEMENT (IOC) LEFT 9.82 01:02.2 (Left: Eye)     Patient location during evaluation: PACU Anesthesia Type: MAC Level of consciousness: awake and alert Pain management: pain level controlled Vital Signs Assessment: post-procedure vital signs reviewed and stable Respiratory status: spontaneous breathing, nonlabored ventilation and respiratory function stable Cardiovascular status: blood pressure returned to baseline and stable Postop Assessment: no apparent nausea or vomiting Anesthetic complications: no   No notable events documented.  Iran Ouch

## 2022-03-04 NOTE — H&P (Signed)
[facility]   Primary Care Physician:  Idelle Crouch, MD Ophthalmologist: Dr. George Ina  Pre-Procedure History & Physical: HPI:  Brendan Callahan is a 76 y.o. male here for cataract surgery.   Past Medical History:  Diagnosis Date   Acute diverticulitis 12/02/2017   GERD (gastroesophageal reflux disease)    Gout    "on daily RX" (12/02/2017)   High cholesterol    History of kidney stones    Hypertension    Type II diabetes mellitus (HCC)    Vertigo    none in over 1 year   Wears dentures    full upper and lower    Past Surgical History:  Procedure Laterality Date   BLADDER STONE REMOVAL  ~ 2004   CAROTID PTA/STENT INTERVENTION Left 10/17/2021   Procedure: CAROTID PTA/STENT INTERVENTION;  Surgeon: Algernon Huxley, MD;  Location: [facility];  Service: Cardiovascular;  Laterality: Left;   CORONARY ARTERY BYPASS GRAFT  2001   CABG X3"   TONSILLECTOMY      Prior to Admission medications   Medication Sig Start Date End Date Taking? Authorizing Provider  allopurinol (ZYLOPRIM) 300 MG tablet Take 300 mg by mouth daily.   Yes [provider]  aspirin EC 81 MG EC tablet Take 1 tablet (81 mg total) by mouth daily at 6 (six) AM. Swallow whole. 10/19/21  Yes Kris Hartmann, NP  atorvastatin (LIPITOR) 20 MG tablet Take 40 mg by mouth daily.   Yes [provider]  Boswellia-Glucosamine-Vit D (OSTEO BI-FLEX ONE PER DAY PO) Take by mouth daily.   Yes [provider]  clopidogrel (PLAVIX) 75 MG tablet Take 75 mg by mouth daily.   Yes [provider]  gabapentin (NEURONTIN) 300 MG capsule Take 300 mg by mouth daily.   Yes [provider]  hydrocortisone 2.5 % lotion Apply topically daily. 11/29/21  Yes [provider]  meclizine (ANTIVERT) 25 MG tablet Take 25 mg by mouth 2 (two) times daily as needed. 12/20/21  Yes [provider]  MEGARED OMEGA-3 KRILL OIL PO Take by mouth daily.   Yes [provider]   metFORMIN (GLUCOPHAGE) 500 MG tablet Take 500 mg by mouth 2 (two) times daily.   Yes [provider]  metoprolol succinate (TOPROL-XL) 50 MG 24 hr tablet Take 50 mg by mouth daily.   Yes [provider]  Multiple Vitamin (MULTIVITAMIN WITH MINERALS) TABS tablet Take 1 tablet by mouth daily.   Yes [provider]  pantoprazole (PROTONIX) 20 MG tablet Take 20 mg by mouth daily.   Yes [provider]  oxyCODONE-acetaminophen (PERCOCET/ROXICET) 5-325 MG tablet Take 1 tablet by mouth every 6 (six) hours as needed for moderate pain. Patient not taking: Reported on 02/25/2022 10/18/21   Kris Hartmann, NP  sildenafil (VIAGRA) 100 MG tablet Take 100 mg by mouth as directed. Patient not taking: Reported on 02/25/2022 08/02/21   [provider]  TRUE METRIX BLOOD GLUCOSE TEST test strip SMARTSIG:Via Meter 06/05/21   [provider]  TRUEplus Lancets 33G MISC  06/05/21   [provider]    Allergies as of 01/15/2022   (No Known Allergies)    Family History  Problem Relation Age of Onset   Hernia Mother    Healthy Father     Social History   Socioeconomic History   Marital status: Divorced    Spouse name: Not on file   Number of children: Not on file   Years  of education: Not on file   Highest education level: Not on file  Occupational History   Not on file  Tobacco Use   Smoking status: Former    Packs/day: 1.00    Years: 20.00    Total pack years: 20.00    Types: Cigarettes    Quit date: 06/09/1978    Years since quitting: 43.7   Smokeless tobacco: Never   Tobacco comments:    12/02/2017 "nothing since 1980s"  Vaping Use   Vaping Use: Never used  Substance and Sexual Activity   Alcohol use: Yes    Comment: 12/02/2017 "couple beers/month"   Drug use: Never   Sexual activity: Not Currently  Other Topics Concern   Not on file  Social History Narrative   Not on file   Social Determinants of Health   Financial  Resource Strain: Not on file  Food Insecurity: Not on file  Transportation Needs: Not on file  Physical Activity: Not on file  Stress: Not on file  Social Connections: Not on file  Intimate Partner Violence: Not on file    Review of Systems: See HPI, otherwise negative ROS  Physical Exam: BP (!) 170/84   Pulse 60   Temp (!) 97.2 F (36.2 C) (Temporal)   Resp 13   Ht '5\' 10"'$  (1.778 m)   Wt 101.2 kg   SpO2 96%   BMI 32.00 kg/m  General:   Alert, cooperative in NAD Head:  Normocephalic and atraumatic. Respiratory:  Normal work of breathing. Cardiovascular:  RRR  Impression/Plan: Brendan Callahan is here for cataract surgery.  Risks, benefits, limitations, and alternatives regarding cataract surgery have been reviewed with the patient.  Questions have been answered.  All parties agreeable.   Birder Robson, MD  03/04/2022, 9:20 AM

## 2022-03-05 ENCOUNTER — Encounter: Payer: Self-pay | Admitting: Ophthalmology

## 2022-03-14 NOTE — Discharge Instructions (Signed)

## 2022-03-18 ENCOUNTER — Other Ambulatory Visit: Payer: Self-pay

## 2022-03-18 ENCOUNTER — Ambulatory Visit
Admission: RE | Admit: 2022-03-18 | Discharge: 2022-03-18 | Disposition: A | Discharge status: Home / Self Care | Payer: Medicare HMO | Attending: Ophthalmology | Admitting: Ophthalmology

## 2022-03-18 ENCOUNTER — Encounter: Payer: Self-pay | Admitting: Ophthalmology

## 2022-03-18 ENCOUNTER — Ambulatory Visit: Payer: Medicare HMO | Admitting: Anesthesiology

## 2022-03-18 ENCOUNTER — Encounter
Admission: RE | Disposition: A | Discharge status: Home / Self Care | Payer: Self-pay | Source: Home / Self Care | Attending: Ophthalmology

## 2022-03-18 DIAGNOSIS — Z7902 Long term (current) use of antithrombotics/antiplatelets: Secondary | ICD-10-CM | POA: Insufficient documentation

## 2022-03-18 DIAGNOSIS — Z951 Presence of aortocoronary bypass graft: Secondary | ICD-10-CM | POA: Insufficient documentation

## 2022-03-18 DIAGNOSIS — K219 Gastro-esophageal reflux disease without esophagitis: Secondary | ICD-10-CM | POA: Insufficient documentation

## 2022-03-18 DIAGNOSIS — H2511 Age-related nuclear cataract, right eye: Secondary | ICD-10-CM | POA: Insufficient documentation

## 2022-03-18 DIAGNOSIS — E1136 Type 2 diabetes mellitus with diabetic cataract: Secondary | ICD-10-CM | POA: Diagnosis not present

## 2022-03-18 DIAGNOSIS — I2510 Atherosclerotic heart disease of native coronary artery without angina pectoris: Secondary | ICD-10-CM | POA: Insufficient documentation

## 2022-03-18 DIAGNOSIS — G709 Myoneural disorder, unspecified: Secondary | ICD-10-CM | POA: Diagnosis not present

## 2022-03-18 DIAGNOSIS — I10 Essential (primary) hypertension: Secondary | ICD-10-CM | POA: Diagnosis not present

## 2022-03-18 DIAGNOSIS — E7800 Pure hypercholesterolemia, unspecified: Secondary | ICD-10-CM | POA: Diagnosis not present

## 2022-03-18 DIAGNOSIS — Z87891 Personal history of nicotine dependence: Secondary | ICD-10-CM | POA: Insufficient documentation

## 2022-03-18 DIAGNOSIS — Z7984 Long term (current) use of oral hypoglycemic drugs: Secondary | ICD-10-CM | POA: Diagnosis not present

## 2022-03-18 DIAGNOSIS — M109 Gout, unspecified: Secondary | ICD-10-CM | POA: Insufficient documentation

## 2022-03-18 HISTORY — PX: CATARACT EXTRACTION W/PHACO: SHX586

## 2022-03-18 LAB — GLUCOSE, CAPILLARY
Glucose-Capillary: 101 mg/dL — ABNORMAL HIGH (ref 70–99)
Glucose-Capillary: 114 mg/dL — ABNORMAL HIGH (ref 70–99)

## 2022-03-18 SURGERY — PHACOEMULSIFICATION, CATARACT, WITH IOL INSERTION
Anesthesia: Monitor Anesthesia Care | Site: Eye | Laterality: Right

## 2022-03-18 MED ORDER — BRIMONIDINE TARTRATE-TIMOLOL 0.2-0.5 % OP SOLN
OPHTHALMIC | Status: DC | PRN
  Administered 2022-03-18: 1 [drp] via OPHTHALMIC

## 2022-03-18 MED ORDER — SIGHTPATH DOSE#1 BSS IO SOLN
INTRAOCULAR | Status: DC | PRN
  Administered 2022-03-18: 1 mL

## 2022-03-18 MED ORDER — SIGHTPATH DOSE#1 BSS IO SOLN
INTRAOCULAR | Status: DC | PRN
  Administered 2022-03-18: 50 mL via OPHTHALMIC

## 2022-03-18 MED ORDER — FENTANYL CITRATE (PF) 100 MCG/2ML IJ SOLN
INTRAMUSCULAR | Status: DC | PRN
  Administered 2022-03-18 (×2): 25 ug via INTRAVENOUS

## 2022-03-18 MED ORDER — ARMC OPHTHALMIC DILATING DROPS
1.0000 | OPHTHALMIC | Status: DC | PRN
  Administered 2022-03-18 (×3): 1 via OPHTHALMIC

## 2022-03-18 MED ORDER — MOXIFLOXACIN HCL 0.5 % OP SOLN
OPHTHALMIC | Status: DC | PRN
  Administered 2022-03-18: 0.2 mL via OPHTHALMIC

## 2022-03-18 MED ORDER — TETRACAINE HCL 0.5 % OP SOLN
1.0000 [drp] | OPHTHALMIC | Status: DC | PRN
  Administered 2022-03-18 (×3): 1 [drp] via OPHTHALMIC

## 2022-03-18 MED ORDER — LACTATED RINGERS IV SOLN: INTRAVENOUS | Status: DC

## 2022-03-18 MED ORDER — SIGHTPATH DOSE#1 BSS IO SOLN
INTRAOCULAR | Status: DC | PRN
  Administered 2022-03-18: 15 mL

## 2022-03-18 MED ORDER — MIDAZOLAM HCL 2 MG/2ML IJ SOLN
INTRAMUSCULAR | Status: DC | PRN
  Administered 2022-03-18: 1 mg via INTRAVENOUS

## 2022-03-18 MED ORDER — SIGHTPATH DOSE#1 NA CHONDROIT SULF-NA HYALURON 40-17 MG/ML IO SOLN
INTRAOCULAR | Status: DC | PRN
  Administered 2022-03-18: 1 mL via INTRAOCULAR

## 2022-03-18 SURGICAL SUPPLY — 15 items
CANNULA ANT/CHMB 27G (MISCELLANEOUS) IMPLANT
CANNULA ANT/CHMB 27GA (MISCELLANEOUS) IMPLANT
CATARACT SUITE SIGHTPATH (MISCELLANEOUS) ×1 IMPLANT
FEE CATARACT SUITE SIGHTPATH (MISCELLANEOUS) ×1 IMPLANT
GLOVE SURG ENC TEXT LTX SZ8 (GLOVE) ×1 IMPLANT
GLOVE SURG TRIUMPH 8.0 PF LTX (GLOVE) ×1 IMPLANT
LENS IOL TECNIS EYHANCE 21.5 (Intraocular Lens) IMPLANT
NDL FILTER BLUNT 18X1 1/2 (NEEDLE) ×1 IMPLANT
NEEDLE FILTER BLUNT 18X1 1/2 (NEEDLE) ×1 IMPLANT
PACK VIT ANT 23G (MISCELLANEOUS) IMPLANT
RING MALYGIN (MISCELLANEOUS) IMPLANT
SUT ETHILON 10-0 CS-B-6CS-B-6 (SUTURE)
SUTURE EHLN 10-0 CS-B-6CS-B-6 (SUTURE) IMPLANT
SYR 3ML LL SCALE MARK (SYRINGE) ×1 IMPLANT
WATER STERILE IRR 250ML POUR (IV SOLUTION) ×1 IMPLANT

## 2022-03-18 NOTE — Transfer of Care (Signed)
Immediate Anesthesia Transfer of Care Note  Patient: Brendan Callahan  Procedure(s) Performed: CATARACT EXTRACTION PHACO AND INTRAOCULAR LENS PLACEMENT (IOC) RIGHT (Right: Eye)  Patient Location: PACU  Anesthesia Type: MAC  Level of Consciousness: awake, alert  and patient cooperative  Airway and Oxygen Therapy: Patient Spontanous Breathing and Patient connected to supplemental oxygen  Post-op Assessment: Post-op Vital signs reviewed, Patient's Cardiovascular Status Stable, Respiratory Function Stable, Patent Airway and No signs of Nausea or vomiting  Post-op Vital Signs: Reviewed and stable  Complications: No notable events documented.

## 2022-03-18 NOTE — Anesthesia Preprocedure Evaluation (Signed)
Anesthesia Evaluation  Patient identified by MRN, date of birth, ID band Patient awake    Reviewed: Allergy & Precautions, NPO status , Patient's Chart, lab work & pertinent test results  Airway Mallampati: III  TM Distance: >3 FB Neck ROM: full    Dental  (+) Upper Dentures, Lower Dentures   Pulmonary neg pulmonary ROS, neg shortness of breath, former smoker,    Pulmonary exam normal        Cardiovascular Exercise Tolerance: Good hypertension, (-) angina+ CAD  Normal cardiovascular exam     Neuro/Psych TIA Neuromuscular disease negative psych ROS   GI/Hepatic Neg liver ROS, GERD  Controlled,  Endo/Other  diabetes, Type 2  Renal/GU      Musculoskeletal   Abdominal   Peds  Hematology negative hematology ROS (+)   Anesthesia Other Findings Past Medical History: 12/02/2017: Acute diverticulitis No date: GERD (gastroesophageal reflux disease) No date: Gout     Comment:  "on daily RX" (12/02/2017) No date: High cholesterol No date: History of kidney stones No date: Hypertension No date: Type II diabetes mellitus ([place]) No date: Vertigo     Comment:  none in over 1 year No date: Wears dentures     Comment:  full upper and lower  Past Surgical History: ~ 2004: BLADDER STONE REMOVAL 10/17/2021: CAROTID PTA/STENT INTERVENTION; Left     Comment:  Procedure: CAROTID PTA/STENT INTERVENTION;  Surgeon:               Algernon Huxley, MD;  Location: [facility];                Service: Cardiovascular;  Laterality: Left; 03/04/2022: CATARACT EXTRACTION W/PHACO; Left     Comment:  Procedure: CATARACT EXTRACTION PHACO AND INTRAOCULAR               LENS PLACEMENT (IOC) LEFT 9.82 01:02.2;  Surgeon:               Birder Robson, MD;  Location: [facility];                Service: Ophthalmology;  Laterality: Left;  Diabetic 2001: CORONARY ARTERY BYPASS GRAFT     Comment:  CABG X3" No date: TONSILLECTOMY  BMI     Body Mass Index: 32.00 kg/m      Reproductive/Obstetrics negative OB ROS                             Anesthesia Physical Anesthesia Plan  ASA: 3  Anesthesia Plan: MAC   Post-op Pain Management:    Induction: Intravenous  PONV Risk Score and Plan:   Airway Management Planned: Natural Airway and Nasal Cannula  Additional Equipment:   Intra-op Plan:   Post-operative Plan:   Informed Consent: I have reviewed the patients History and Physical, chart, labs and discussed the procedure including the risks, benefits and alternatives for the proposed anesthesia with the patient or authorized representative who has indicated his/her understanding and acceptance.     Dental Advisory Given  Plan Discussed with: Anesthesiologist, CRNA and Surgeon  Anesthesia Plan Comments: (Patient consented for risks of anesthesia including but not limited to:  - adverse reactions to medications - damage to eyes, teeth, lips or other oral mucosa - nerve damage due to positioning  - sore throat or hoarseness - Damage to heart, brain, nerves, lungs, other parts of body or loss of life  Patient voiced understanding.)  Anesthesia Quick Evaluation  

## 2022-03-18 NOTE — Anesthesia Postprocedure Evaluation (Signed)
Anesthesia Post Note  Patient: Hakeen Shipes  Procedure(s) Performed: CATARACT EXTRACTION PHACO AND INTRAOCULAR LENS PLACEMENT (IOC) RIGHT (Right: Eye)  Patient location during evaluation: Phase II Anesthesia Type: MAC Level of consciousness: awake and alert Pain management: pain level controlled Vital Signs Assessment: post-procedure vital signs reviewed and stable Respiratory status: spontaneous breathing, nonlabored ventilation, respiratory function stable and patient connected to nasal cannula oxygen Cardiovascular status: stable and blood pressure returned to baseline Postop Assessment: no apparent nausea or vomiting Anesthetic complications: no   No notable events documented.   Last Vitals:  Vitals:   03/18/22 0750 03/18/22 0756  BP: 113/80 (!) 140/86  Pulse: 62 64  Resp: 14 14  Temp: 36.4 C   SpO2: 92% 92%    Last Pain:  Vitals:   03/18/22 0641  TempSrc: Temporal  PainSc: 0-No pain                 Precious Haws Samreen Seltzer

## 2022-03-18 NOTE — Op Note (Signed)
PREOPERATIVE DIAGNOSIS:  Nuclear sclerotic cataract of the right eye.   POSTOPERATIVE DIAGNOSIS:  Cataract   OPERATIVE PROCEDURE:ORPROCALL@   SURGEON:  Brendan Robson, MD.   ANESTHESIA:  Anesthesiologist: Piscitello, Precious Haws, MD CRNA: Jacqualin Combes, CRNA  1.      Managed anesthesia care. 2.      0.22m of Shugarcaine was instilled in the eye following the paracentesis.   COMPLICATIONS:  None.   TECHNIQUE:   Stop and chop   DESCRIPTION OF PROCEDURE:  The patient was examined and consented in the preoperative holding area where the aforementioned topical anesthesia was applied to the right eye and then brought back to the Operating Room where the right eye was prepped and draped in the usual sterile ophthalmic fashion and a lid speculum was placed. A paracentesis was created with the side port blade and the anterior chamber was filled with viscoelastic. A near clear corneal incision was performed with the steel keratome. A continuous curvilinear capsulorrhexis was performed with a cystotome followed by the capsulorrhexis forceps. Hydrodissection and hydrodelineation were carried out with BSS on a blunt cannula. The lens was removed in a stop and chop  technique and the remaining cortical material was removed with the irrigation-aspiration handpiece. The capsular bag was inflated with viscoelastic and the Technis ZCB00  lens was placed in the capsular bag without complication. The remaining viscoelastic was removed from the eye with the irrigation-aspiration handpiece. The wounds were hydrated. The anterior chamber was flushed with BSS and the eye was inflated to physiologic pressure. 0.184mof Vigamox was placed in the anterior chamber. The wounds were found to be water tight. The eye was dressed with Combigan. The patient was given protective glasses to wear throughout the day and a shield with which to sleep tonight. The patient was also given drops with which to begin a drop regimen today  and will follow-up with me in one day. Implant Name Type Inv. Item Serial No. Manufacturer Lot No. LRB No. Used Action  LENS IOL TECNIS EYHANCE 21.5 - [id number] Lens LENS IOL TECNIS EYHANCE 21.5 [id number]  Right 1 Implanted   Procedure(s) with comments: CATARACT EXTRACTION PHACO AND INTRAOCULAR LENS PLACEMENT (IOC) RIGHT (Right) - 8.18 0:52.5  Electronically signed: WiBirder Robson0/03/2022 7:50 AM

## 2022-03-18 NOTE — H&P (Signed)
[facility]   Primary Care Physician:  Idelle Crouch, MD Ophthalmologist: Dr. George Ina  Pre-Procedure History & Physical: HPI:  Brendan Callahan is a 76 y.o. male here for cataract surgery.   Past Medical History:  Diagnosis Date   Acute diverticulitis 12/02/2017   GERD (gastroesophageal reflux disease)    Gout    "on daily RX" (12/02/2017)   High cholesterol    History of kidney stones    Hypertension    Type II diabetes mellitus (HCC)    Vertigo    none in over 1 year   Wears dentures    full upper and lower    Past Surgical History:  Procedure Laterality Date   BLADDER STONE REMOVAL  ~ 2004   CAROTID PTA/STENT INTERVENTION Left 10/17/2021   Procedure: CAROTID PTA/STENT INTERVENTION;  Surgeon: Algernon Huxley, MD;  Location: [place] [facility];  Service: Cardiovascular;  Laterality: Left;   CATARACT EXTRACTION W/PHACO Left 03/04/2022   Procedure: CATARACT EXTRACTION PHACO AND INTRAOCULAR LENS PLACEMENT (IOC) LEFT 9.82 01:02.2;  Surgeon: Birder Robson, MD;  Location: [place];  Service: Ophthalmology;  Laterality: Left;  Diabetic   CORONARY ARTERY BYPASS GRAFT  2001   CABG X3"   TONSILLECTOMY      Prior to Admission medications   Medication Sig Start Date End Date Taking? Authorizing Provider  allopurinol (ZYLOPRIM) 300 MG tablet Take 300 mg by mouth daily.   Yes [provider]  aspirin EC 81 MG EC tablet Take 1 tablet (81 mg total) by mouth daily at 6 (six) AM. Swallow whole. 10/19/21  Yes Kris Hartmann, NP  atorvastatin (LIPITOR) 20 MG tablet Take 40 mg by mouth daily.   Yes [provider]  Boswellia-Glucosamine-Vit D (OSTEO BI-FLEX ONE PER DAY PO) Take by mouth daily.   Yes [provider]  clopidogrel (PLAVIX) 75 MG tablet Take 75 mg by mouth daily.   Yes [provider]  gabapentin (NEURONTIN) 300 MG capsule Take 300 mg by mouth daily.   Yes [provider]  hydrocortisone 2.5 % lotion Apply  topically daily. 11/29/21  Yes [provider]  meclizine (ANTIVERT) 25 MG tablet Take 25 mg by mouth 2 (two) times daily as needed. 12/20/21  Yes [provider]  MEGARED OMEGA-3 KRILL OIL PO Take by mouth daily.   Yes [provider]  metFORMIN (GLUCOPHAGE) 500 MG tablet Take 500 mg by mouth 2 (two) times daily.   Yes [provider]  metoprolol succinate (TOPROL-XL) 50 MG 24 hr tablet Take 50 mg by mouth daily.   Yes [provider]  Multiple Vitamin (MULTIVITAMIN WITH MINERALS) TABS tablet Take 1 tablet by mouth daily.   Yes [provider]  pantoprazole (PROTONIX) 20 MG tablet Take 20 mg by mouth daily.   Yes [provider]  oxyCODONE-acetaminophen (PERCOCET/ROXICET) 5-325 MG tablet Take 1 tablet by mouth every 6 (six) hours as needed for moderate pain. Patient not taking: Reported on 02/25/2022 10/18/21   Kris Hartmann, NP  sildenafil (VIAGRA) 100 MG tablet Take 100 mg by mouth as directed. Patient not taking: Reported on 02/25/2022 08/02/21   [provider]  TRUE METRIX BLOOD GLUCOSE TEST test strip [place] Meter 06/05/21   [provider]  TRUEplus Lancets 33G MISC  06/05/21   [provider]    Allergies as of 01/15/2022   (No Known Allergies)    Family History  Problem Relation Age of Onset   Hernia Mother  Healthy Father     Social History   Socioeconomic History   Marital status: Divorced    Spouse name: Not on file   Number of children: Not on file   Years of education: Not on file   Highest education level: Not on file  Occupational History   Not on file  Tobacco Use   Smoking status: Former    Packs/day: 1.00    Years: 20.00    Total pack years: 20.00    Types: Cigarettes    Quit date: 06/09/1978    Years since quitting: 43.8   Smokeless tobacco: Never   Tobacco comments:    12/02/2017 "nothing since 1980s"  Vaping Use   Vaping Use: Never used  Substance and  Sexual Activity   Alcohol use: Yes    Comment: 12/02/2017 "couple beers/month"   Drug use: Never   Sexual activity: Not Currently  Other Topics Concern   Not on file  Social History Narrative   Not on file   Social Determinants of Health   Financial Resource Strain: Not on file  Food Insecurity: Not on file  Transportation Needs: Not on file  Physical Activity: Not on file  Stress: Not on file  Social Connections: Not on file  Intimate Partner Violence: Not on file    Review of Systems: See HPI, otherwise negative ROS  Physical Exam: BP (!) 148/81   Pulse 69   Temp (!) 97.5 F (36.4 C) (Temporal)   Resp 10   Ht '5\' 10"'$  (1.778 m)   Wt 101.2 kg   SpO2 93%   BMI 32.00 kg/m  General:   Alert, cooperative in NAD Head:  Normocephalic and atraumatic. Respiratory:  Normal work of breathing. Cardiovascular:  RRR  Impression/Plan: Brendan Callahan is here for cataract surgery.  Risks, benefits, limitations, and alternatives regarding cataract surgery have been reviewed with the patient.  Questions have been answered.  All parties agreeable.   Birder Robson, MD  03/18/2022, 7:17 AM

## 2022-03-19 ENCOUNTER — Encounter: Payer: Self-pay | Admitting: Ophthalmology

## 2022-03-24 ENCOUNTER — Other Ambulatory Visit (INDEPENDENT_AMBULATORY_CARE_PROVIDER_SITE_OTHER): Payer: Self-pay | Admitting: Nurse Practitioner

## 2022-03-24 DIAGNOSIS — I6523 Occlusion and stenosis of bilateral carotid arteries: Secondary | ICD-10-CM

## 2022-03-26 ENCOUNTER — Encounter (INDEPENDENT_AMBULATORY_CARE_PROVIDER_SITE_OTHER): Payer: Self-pay | Admitting: Nurse Practitioner

## 2022-03-26 ENCOUNTER — Ambulatory Visit (INDEPENDENT_AMBULATORY_CARE_PROVIDER_SITE_OTHER): Payer: Medicare HMO

## 2022-03-26 ENCOUNTER — Ambulatory Visit (INDEPENDENT_AMBULATORY_CARE_PROVIDER_SITE_OTHER): Payer: Medicare HMO | Admitting: Nurse Practitioner

## 2022-03-26 VITALS — BP 166/83 | HR 52 | Resp 16 | Wt 222.0 lb

## 2022-03-26 DIAGNOSIS — I10 Essential (primary) hypertension: Secondary | ICD-10-CM | POA: Diagnosis not present

## 2022-03-26 DIAGNOSIS — I6522 Occlusion and stenosis of left carotid artery: Secondary | ICD-10-CM

## 2022-03-26 DIAGNOSIS — I6523 Occlusion and stenosis of bilateral carotid arteries: Secondary | ICD-10-CM

## 2022-03-26 DIAGNOSIS — E118 Type 2 diabetes mellitus with unspecified complications: Secondary | ICD-10-CM

## 2022-03-31 ENCOUNTER — Encounter: Payer: Self-pay | Admitting: Ophthalmology

## 2022-03-31 NOTE — Addendum Note (Signed)
Addendum  created 03/31/22 0920 by Tobie Poet, CRNA   Intraprocedure Event deleted, Intraprocedure Event edited

## 2022-04-05 ENCOUNTER — Encounter (INDEPENDENT_AMBULATORY_CARE_PROVIDER_SITE_OTHER): Payer: Self-pay | Admitting: Nurse Practitioner

## 2022-04-05 NOTE — Progress Notes (Signed)
Subjective:    Patient ID: Brendan Callahan, male    DOB: 01/02/1946, 76 y.o.   MRN: [id number] Chief Complaint  Patient presents with   Follow-up    Ultrasound follow up    The patient is seen for follow up evaluation of carotid stenosis status post righ carotid endarterectomy on 10/08/2021.  There were no post operative problems or complications related to the surgery.  The patient denies neck or incisional pain.  He notes that the previous bruit he heard resting his colon.   The patient denies interval amaurosis fugax. There is no recent history of TIA symptoms or focal motor deficits. There is no prior documented CVA.   The patient denies headache.   The patient is taking enteric-coated aspirin 81 mg daily.   No recent shortening of the patient's walking distance or new symptoms consistent with claudication.  No history of rest pain symptoms. No new ulcers or wounds of the lower extremities have occurred.   There is no history of DVT, PE or superficial thrombophlebitis. No recent episodes of angina or shortness of breath documented.   The patient has a known occluded right ICA.  The left ICA is patent with no evidence of hemodynamically significant stenosis.  Stent is patent.  Antegrade flow in bilateral vertebral arteries with multiphasic waveforms in the bilateral subclavian arteries.    Review of Systems  All other systems reviewed and are negative.      Objective:   Physical Exam Vitals reviewed.  HENT:     Head: Normocephalic.  Neck:     Vascular: Carotid bruit present.  Cardiovascular:     Rate and Rhythm: Normal rate.     Pulses: Normal pulses.  Pulmonary:     Effort: Pulmonary effort is normal.  Skin:    General: Skin is warm and dry.  Neurological:     Mental Status: He is alert and oriented to person, place, and time.  Psychiatric:        Mood and Affect: Mood normal.        Behavior: Behavior normal.        Thought Content: Thought content normal.         Judgment: Judgment normal.     BP (!) 166/83 (BP Location: Left Arm)   Pulse (!) 52   Resp 16   Wt 222 lb (100.7 kg)   BMI 31.85 kg/m   Past Medical History:  Diagnosis Date   Acute diverticulitis 12/02/2017   GERD (gastroesophageal reflux disease)    Gout    "on daily RX" (12/02/2017)   High cholesterol    History of kidney stones    Hypertension    Type II diabetes mellitus (HCC)    Vertigo    none in over 1 year   Wears dentures    full upper and lower    Social History   Socioeconomic History   Marital status: Divorced    Spouse name: Not on file   Number of children: Not on file   Years of education: Not on file   Highest education level: Not on file  Occupational History   Not on file  Tobacco Use   Smoking status: Former    Packs/day: 1.00    Years: 20.00    Total pack years: 20.00    Types: Cigarettes    Quit date: 06/09/1978    Years since quitting: 43.8   Smokeless tobacco: Never   Tobacco comments:    12/02/2017 "nothing since  1980s"  Vaping Use   Vaping Use: Never used  Substance and Sexual Activity   Alcohol use: Yes    Comment: 12/02/2017 "couple beers/month"   Drug use: Never   Sexual activity: Not Currently  Other Topics Concern   Not on file  Social History Narrative   Not on file   Social Determinants of Health   Financial Resource Strain: Not on file  Food Insecurity: Not on file  Transportation Needs: Not on file  Physical Activity: Not on file  Stress: Not on file  Social Connections: Not on file  Intimate Partner Violence: Not on file    Past Surgical History:  Procedure Laterality Date   BLADDER STONE REMOVAL  ~ 2004   CAROTID PTA/STENT INTERVENTION Left 10/17/2021   Procedure: CAROTID PTA/STENT INTERVENTION;  Surgeon: Algernon Huxley, MD;  Location: [facility];  Service: Cardiovascular;  Laterality: Left;   CATARACT EXTRACTION W/PHACO Right 03/18/2022   Procedure: CATARACT EXTRACTION PHACO AND INTRAOCULAR LENS  PLACEMENT ([place]) RIGHT;  Surgeon: Birder Robson, MD;  Location: [facility];  Service: Ophthalmology;  Laterality: Right;  8.18 0:52.5   CATARACT EXTRACTION W/PHACO Left 03/04/2022   Procedure: CATARACT EXTRACTION PHACO AND INTRAOCULAR LENS PLACEMENT ([facility]) LEFT 9.82 01:02.2;  Surgeon: Birder Robson, MD;  Location: [facility];  Service: Ophthalmology;  Laterality: Left;  Diabetic   CORONARY ARTERY BYPASS GRAFT  2001   CABG X3"   TONSILLECTOMY      Family History  Problem Relation Age of Onset   Hernia Mother    Healthy Father     No Known Allergies     Latest Ref Rng & Units 10/18/2021    4:31 AM 07/13/2020    8:27 PM 12/04/2017    6:59 AM  CBC  WBC 4.0 - 10.5 K/uL 6.7  8.0  5.8   Hemoglobin 13.0 - 17.0 g/dL 14.2  15.2  14.4   Hematocrit 39.0 - 52.0 % 42.6  44.3  44.2   Platelets 150 - 400 K/uL 164  189  150       CMP     Component Value Date/Time   NA 140 10/18/2021 0431   K 4.0 10/18/2021 0431   CL 109 10/18/2021 0431   CO2 27 10/18/2021 0431   GLUCOSE 144 (H) 10/18/2021 0431   BUN 15 10/18/2021 0431   CREATININE 1.19 10/18/2021 0431   CALCIUM 8.4 (L) 10/18/2021 0431   PROT 7.6 07/13/2020 2027   ALBUMIN 4.1 07/13/2020 2027   AST 32 07/13/2020 2027   ALT 23 07/13/2020 2027   ALKPHOS 96 07/13/2020 2027   BILITOT 0.6 07/13/2020 2027   GFRNONAA >60 10/18/2021 0431   GFRAA >60 12/04/2017 0659     No results found.     Assessment & Plan:   1. Carotid stenosis, left Recommend:  Given the patient's asymptomatic subcritical stenosis no further invasive testing or surgery at this time.  Duplex ultrasound shows <40% stenosis bilaterally.  Continue antiplatelet therapy as prescribed Continue management of CAD, HTN and Hyperlipidemia Healthy heart diet,  encouraged exercise at least 4 times per week Follow up in 6 months with duplex ultrasound and physical exam    2. Benign essential hypertension Continue antihypertensive medications as  already ordered, these medications have been reviewed and there are no changes at this time.   3. Type II diabetes mellitus with complication ([place]) Continue hypoglycemic medications as already ordered, these medications have been reviewed and there are no changes at this  time.  Hgb A1C to be monitored as already arranged by primary service    Current Outpatient Medications on File Prior to Visit  Medication Sig Dispense Refill   allopurinol (ZYLOPRIM) 300 MG tablet Take 300 mg by mouth daily.     aspirin EC 81 MG EC tablet Take 1 tablet (81 mg total) by mouth daily at 6 (six) AM. Swallow whole. 30 tablet 11   atorvastatin (LIPITOR) 20 MG tablet Take 40 mg by mouth daily.     Boswellia-Glucosamine-Vit D (OSTEO BI-FLEX ONE PER DAY PO) Take by mouth daily.     clopidogrel (PLAVIX) 75 MG tablet Take 75 mg by mouth daily.     gabapentin (NEURONTIN) 300 MG capsule Take 300 mg by mouth daily.     hydrocortisone 2.5 % lotion Apply topically daily.     meclizine (ANTIVERT) 25 MG tablet Take 25 mg by mouth 2 (two) times daily as needed.     MEGARED OMEGA-3 KRILL OIL PO Take by mouth daily.     metFORMIN (GLUCOPHAGE) 500 MG tablet Take 500 mg by mouth 2 (two) times daily.     metoprolol succinate (TOPROL-XL) 50 MG 24 hr tablet Take 50 mg by mouth daily.     Multiple Vitamin (MULTIVITAMIN WITH MINERALS) TABS tablet Take 1 tablet by mouth daily.     pantoprazole (PROTONIX) 20 MG tablet Take 20 mg by mouth daily.     TRUE METRIX BLOOD GLUCOSE TEST test strip SMARTSIG:Via Meter     TRUEplus Lancets 33G MISC      oxyCODONE-acetaminophen (PERCOCET/ROXICET) 5-325 MG tablet Take 1 tablet by mouth every 6 (six) hours as needed for moderate pain. (Patient not taking: Reported on 02/25/2022) 20 tablet 0   sildenafil (VIAGRA) 100 MG tablet Take 100 mg by mouth as directed. (Patient not taking: Reported on 02/25/2022)     No current facility-administered medications on file prior to visit.    There are no  Patient Instructions on file for this visit. No follow-ups on file.   Kris Hartmann, NP

## 2022-04-07 ENCOUNTER — Encounter (INDEPENDENT_AMBULATORY_CARE_PROVIDER_SITE_OTHER): Payer: Self-pay

## 2022-09-17 ENCOUNTER — Other Ambulatory Visit (INDEPENDENT_AMBULATORY_CARE_PROVIDER_SITE_OTHER): Payer: Self-pay | Admitting: Nurse Practitioner

## 2022-09-17 DIAGNOSIS — I6523 Occlusion and stenosis of bilateral carotid arteries: Secondary | ICD-10-CM

## 2022-09-26 ENCOUNTER — Ambulatory Visit (INDEPENDENT_AMBULATORY_CARE_PROVIDER_SITE_OTHER): Payer: Medicare (Managed Care) | Admitting: Vascular Surgery

## 2022-09-26 ENCOUNTER — Ambulatory Visit (INDEPENDENT_AMBULATORY_CARE_PROVIDER_SITE_OTHER): Payer: Medicare (Managed Care)

## 2022-09-26 ENCOUNTER — Encounter (INDEPENDENT_AMBULATORY_CARE_PROVIDER_SITE_OTHER): Payer: Self-pay | Admitting: Vascular Surgery

## 2022-09-26 VITALS — BP 142/82 | HR 58 | Resp 16 | Wt 210.0 lb

## 2022-09-26 DIAGNOSIS — I6523 Occlusion and stenosis of bilateral carotid arteries: Secondary | ICD-10-CM

## 2022-09-26 DIAGNOSIS — E118 Type 2 diabetes mellitus with unspecified complications: Secondary | ICD-10-CM | POA: Diagnosis not present

## 2022-09-26 DIAGNOSIS — E785 Hyperlipidemia, unspecified: Secondary | ICD-10-CM

## 2022-09-26 DIAGNOSIS — I10 Essential (primary) hypertension: Secondary | ICD-10-CM

## 2022-09-26 NOTE — Progress Notes (Signed)
MRN : [id number]  Brendan Callahan is a 77 y.o. (11/11/1945) male who presents with chief complaint of  Chief Complaint  Patient presents with   Follow-up    Ultrasound follow up  .  History of Present Illness: Patient returns today in follow up of his carotid disease.  He is doing well today.  He denies any focal neurologic symptoms. Specifically, the patient denies amaurosis fugax, speech or swallowing difficulties, or arm or leg weakness or numbness.  About 11 months ago, he underwent left carotid stent placement for high-grade stenosis with a known right carotid artery occlusion.  His duplex today shows a patent stent.  His velocities without a stent would be in the 40 to 59% range which is likely due to compensatory flow as well as the presence of a stent, but for stent velocities these would be in the range of normal.  His right carotid artery remains occluded.  Current Outpatient Medications  Medication Sig Dispense Refill   allopurinol (ZYLOPRIM) 300 MG tablet Take 300 mg by mouth daily.     aspirin EC 81 MG EC tablet Take 1 tablet (81 mg total) by mouth daily at 6 (six) AM. Swallow whole. 30 tablet 11   atorvastatin (LIPITOR) 20 MG tablet Take 40 mg by mouth daily.     Boswellia-Glucosamine-Vit D (OSTEO BI-FLEX ONE PER DAY PO) Take by mouth daily.     clopidogrel (PLAVIX) 75 MG tablet Take 75 mg by mouth daily.     gabapentin (NEURONTIN) 300 MG capsule Take 300 mg by mouth daily.     hydrocortisone 2.5 % lotion Apply topically daily.     meclizine (ANTIVERT) 25 MG tablet Take 25 mg by mouth 2 (two) times daily as needed.     MEGARED OMEGA-3 KRILL OIL PO Take by mouth daily.     metFORMIN (GLUCOPHAGE) 500 MG tablet Take 500 mg by mouth 2 (two) times daily.     metoprolol succinate (TOPROL-XL) 50 MG 24 hr tablet Take 50 mg by mouth daily.     Multiple Vitamin (MULTIVITAMIN WITH MINERALS) TABS tablet Take 1 tablet by mouth daily.     pantoprazole (PROTONIX) 20 MG tablet Take 20 mg by  mouth daily.     TRUE METRIX BLOOD GLUCOSE TEST test strip SMARTSIG:Via Meter     TRUEplus Lancets 33G MISC      oxyCODONE-acetaminophen (PERCOCET/ROXICET) 5-325 MG tablet Take 1 tablet by mouth every 6 (six) hours as needed for moderate pain. (Patient not taking: Reported on 02/25/2022) 20 tablet 0   sildenafil (VIAGRA) 100 MG tablet Take 100 mg by mouth as directed. (Patient not taking: Reported on 02/25/2022)     No current facility-administered medications for this visit.    Past Medical History:  Diagnosis Date   Acute diverticulitis 12/02/2017   GERD (gastroesophageal reflux disease)    Gout    "on daily RX" (12/02/2017)   High cholesterol    History of kidney stones    Hypertension    Type II diabetes mellitus    Vertigo    none in over 1 year   Wears dentures    full upper and lower    Past Surgical History:  Procedure Laterality Date   BLADDER STONE REMOVAL  ~ 2004   CAROTID PTA/STENT INTERVENTION Left 10/17/2021   Procedure: CAROTID PTA/STENT INTERVENTION;  Surgeon: Annice Needy, MD;  Location: [facility];  Service: Cardiovascular;  Laterality: Left;   CATARACT EXTRACTION W/PHACO Right 03/18/2022  Procedure: CATARACT EXTRACTION PHACO AND INTRAOCULAR LENS PLACEMENT ([facility]) RIGHT;  Surgeon: Galen Manila, MD;  Location: [facility];  Service: Ophthalmology;  Laterality: Right;  8.18 0:52.5   CATARACT EXTRACTION W/PHACO Left 03/04/2022   Procedure: CATARACT EXTRACTION PHACO AND INTRAOCULAR LENS PLACEMENT ([facility]) LEFT 9.82 01:02.2;  Surgeon: Galen Manila, MD;  Location: [facility];  Service: Ophthalmology;  Laterality: Left;  Diabetic   CORONARY ARTERY BYPASS GRAFT  2001   CABG X3"   TONSILLECTOMY       Social History   Tobacco Use   Smoking status: Former    Packs/day: 1.00    Years: 20.00    Additional pack years: 0.00    Total pack years: 20.00    Types: Cigarettes    Quit date: 06/09/1978    Years since quitting: 44.3   Smokeless  tobacco: Never   Tobacco comments:    12/02/2017 "nothing since 1980s"  Vaping Use   Vaping Use: Never used  Substance Use Topics   Alcohol use: Yes    Comment: 12/02/2017 "couple beers/month"   Drug use: Never      Family History  Problem Relation Age of Onset   Hernia Mother    Healthy Father   No bleeding or clotting disorders  No Known Allergies  REVIEW OF SYSTEMS (Negative unless checked)   Constitutional: Weight loss  Fever  Chills Cardiac: Chest pain   Chest pressure   Palpitations   Shortness of breath when laying flat   Shortness of breath at rest   Shortness of breath with exertion. Vascular:  Pain in legs with walking   Pain in legs at rest   Pain in legs when laying flat   Claudication   Pain in feet when walking  Pain in feet at rest  Pain in feet when laying flat   History of DVT   Phlebitis   Swelling in legs   Varicose veins   Non-healing ulcers Pulmonary:   Uses home oxygen   Productive cough   Hemoptysis   Wheeze  COPD   Asthma Neurologic:  Dizziness  Blackouts   Seizures   History of stroke   History of TIA  Aphasia   Temporary blindness   Dysphagia   Weakness or numbness in arms   Weakness or numbness in legs Musculoskeletal:  Arthritis   Joint swelling   Joint pain   Low back pain Hematologic:  Easy bruising  Easy bleeding   Hypercoagulable state   Anemic  Hepatitis Gastrointestinal:  Blood in stool   Vomiting blood  Gastroesophageal reflux/heartburn   Abdominal pain Genitourinary:  Chronic kidney disease   Difficult urination  Frequent urination  Burning with urination   Hematuria Skin:  Rashes   Ulcers   Wounds Psychological:  History of anxiety    History of major depression.  Physical Examination  BP (!) 142/82 (BP Location: Left Arm)   Pulse (!) 58   Resp 16   Wt 210 lb (95.3 kg)   BMI 30.13 kg/m  Gen:  WD/WN, NAD Head: [place]/AT,  No temporalis wasting. Ear/Nose/Throat: Hearing grossly intact, nares w/o erythema or drainage Eyes: Conjunctiva clear. Sclera non-icteric Neck: Supple.  Trachea midline Pulmonary:  Good air movement, no use of accessory muscles.  Cardiac: RRR, no JVD Vascular:  Vessel Right Left  Radial Palpable Palpable           Musculoskeletal: M/S 5/5 throughout.  No deformity or atrophy. No edema. Neurologic: Sensation grossly intact in extremities.  Symmetrical.  Speech is fluent.  Psychiatric: Judgment intact, Mood & affect appropriate for pt's clinical situation. Dermatologic: No rashes or ulcers noted.  No cellulitis or open wounds.      Labs No results found for this or any previous visit (from the past 2160 hour(s)).  Radiology No results found.  Assessment/Plan  Carotid artery stenosis His duplex today shows a patent stent.  His velocities without a stent would be in the 40 to 59% range which is likely due to compensatory flow as well as the presence of a stent, but for stent velocities these would be in the range of normal.  His right carotid artery remains occluded. No role for intervention.  Continue current medical regimen.  Recheck in 6 months with noninvasive studies and if things are stable, we can go to an annual follow-up after that.   Benign essential hypertension blood pressure control important in reducing the progression of atherosclerotic disease. On appropriate oral medications.     Type II diabetes mellitus with complication (HCC) blood glucose control important in reducing the progression of atherosclerotic disease. Also, involved in wound healing. On appropriate medications.     Hyperlipidemia LDL goal <100 lipid control important in reducing the progression of atherosclerotic disease. Continue statin therapy  Festus Barren, MD  09/26/2022 9:16 AM    This note was created with Dragon medical transcription system.  Any errors from dictation are purely  unintentional

## 2022-09-26 NOTE — Assessment & Plan Note (Signed)
His duplex today shows a patent stent.  His velocities without a stent would be in the 40 to 59% range which is likely due to compensatory flow as well as the presence of a stent, but for stent velocities these would be in the range of normal.  His right carotid artery remains occluded. No role for intervention.  Continue current medical regimen.  Recheck in 6 months with noninvasive studies and if things are stable, we can go to an annual follow-up after that.

## 2023-04-03 ENCOUNTER — Ambulatory Visit (INDEPENDENT_AMBULATORY_CARE_PROVIDER_SITE_OTHER): Payer: Medicare (Managed Care)

## 2023-04-03 ENCOUNTER — Ambulatory Visit (INDEPENDENT_AMBULATORY_CARE_PROVIDER_SITE_OTHER): Payer: Medicare (Managed Care) | Admitting: Vascular Surgery

## 2023-04-03 ENCOUNTER — Encounter (INDEPENDENT_AMBULATORY_CARE_PROVIDER_SITE_OTHER): Payer: Self-pay | Admitting: Vascular Surgery

## 2023-04-03 VITALS — BP 111/71 | HR 60 | Resp 16 | Wt 224.0 lb

## 2023-04-03 DIAGNOSIS — I10 Essential (primary) hypertension: Secondary | ICD-10-CM | POA: Diagnosis not present

## 2023-04-03 DIAGNOSIS — E785 Hyperlipidemia, unspecified: Secondary | ICD-10-CM

## 2023-04-03 DIAGNOSIS — I6523 Occlusion and stenosis of bilateral carotid arteries: Secondary | ICD-10-CM

## 2023-04-03 DIAGNOSIS — E118 Type 2 diabetes mellitus with unspecified complications: Secondary | ICD-10-CM

## 2023-04-03 NOTE — Progress Notes (Signed)
MRN : [id number]  Brendan Callahan is a 77 y.o. ([birth year]) male who presents with chief complaint of  Chief Complaint  Patient presents with   Follow-up    6 month carotid follow up  .  History of Present Illness: Patient returns in follow-up of his carotid disease.  He is almost a year and a half status post left carotid stent placement for high-grade stenosis with a contralateral occlusion.  He is doing well today.  He does still have pulsatile tinnitus in the left ear.  No focal neurologic symptoms. Specifically, the patient denies amaurosis fugax, speech or swallowing difficulties, or arm or leg weakness or numbness. 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   Current Outpatient Medications  Medication Sig Dispense Refill   allopurinol (ZYLOPRIM) 300 MG tablet Take 300 mg by mouth daily.     aspirin EC 81 MG EC tablet Take 1 tablet (81 mg total) by mouth daily at 6 (six) AM. Swallow whole. 30 tablet 11   atorvastatin (LIPITOR) 20 MG tablet Take 40 mg by mouth daily.     Boswellia-Glucosamine-Vit D (OSTEO BI-FLEX ONE PER DAY PO) Take by mouth daily.     clopidogrel (PLAVIX) 75 MG tablet Take 75 mg by mouth daily.     gabapentin (NEURONTIN) 300 MG capsule Take 300 mg by mouth daily.     hydrocortisone 2.5 % lotion Apply topically daily.     meclizine (ANTIVERT) 25 MG tablet Take 25 mg by mouth 2 (two) times daily as needed.     MEGARED OMEGA-3 KRILL OIL PO Take by mouth daily.     metFORMIN (GLUCOPHAGE) 500 MG tablet Take 500 mg by mouth 2 (two) times daily.     metoprolol succinate (TOPROL-XL) 50 MG 24 hr tablet Take 50 mg by mouth daily.     Multiple Vitamin (MULTIVITAMIN WITH MINERALS) TABS tablet Take 1 tablet by mouth daily.     pantoprazole (PROTONIX) 20 MG tablet Take 20 mg by mouth daily.     TRUE METRIX BLOOD GLUCOSE TEST test strip SMARTSIG:Via Meter     TRUEplus Lancets 33G MISC      oxyCODONE-acetaminophen  (PERCOCET/ROXICET) 5-325 MG tablet Take 1 tablet by mouth every 6 (six) hours as needed for moderate pain. (Patient not taking: Reported on 02/25/2022) 20 tablet 0   sildenafil (VIAGRA) 100 MG tablet Take 100 mg by mouth as directed. (Patient not taking: Reported on 02/25/2022)     No current facility-administered medications for this visit.    Past Medical History:  Diagnosis Date   Acute diverticulitis 12/02/2017   GERD (gastroesophageal reflux disease)    Gout    "on daily RX" (12/02/2017)   High cholesterol    History of kidney stones    Hypertension    Type II diabetes mellitus (HCC)    Vertigo    none in over 1 year   Wears dentures    full upper and lower    Past Surgical History:  Procedure Laterality Date   BLADDER STONE REMOVAL  ~ 2004   CAROTID PTA/STENT INTERVENTION Left 10/17/2021   Procedure: CAROTID PTA/STENT INTERVENTION;  Surgeon: Annice Needy, MD;  Location: [facility];  Service: Cardiovascular;  Laterality: Left;   CATARACT EXTRACTION W/PHACO Right 03/18/2022   Procedure: CATARACT EXTRACTION PHACO AND INTRAOCULAR LENS PLACEMENT ([facility]) RIGHT;  Surgeon: Galen Manila, MD;  Location: [facility];  Service: Ophthalmology;  Laterality: Right;  8.18 0:52.5   CATARACT EXTRACTION W/PHACO Left 03/04/2022   Procedure: CATARACT EXTRACTION PHACO AND INTRAOCULAR LENS PLACEMENT ([facility]) LEFT 9.82 01:02.2;  Surgeon: Galen Manila, MD;  Location: [facility];  Service: Ophthalmology;  Laterality: Left;  Diabetic   CORONARY ARTERY BYPASS GRAFT  2001   CABG X3"   TONSILLECTOMY       Social History   Tobacco Use   Smoking status: Former    Current packs/day: 0.00    Average packs/day: 1 pack/day for 20.0 years (20.0 ttl pk-yrs)    Types: Cigarettes    Start date: 06/09/1958    Quit date: 06/09/1978    Years since quitting: 44.8   Smokeless tobacco: Never   Tobacco comments:    12/02/2017 "nothing since 1980s"  Vaping Use   Vaping status: Never  Used  Substance Use Topics   Alcohol use: Yes    Comment: 12/02/2017 "couple beers/month"   Drug use: Never      Family History  Problem Relation Age of Onset   Hernia Mother    Healthy Father     No Known Allergies   REVIEW OF SYSTEMS (Negative unless checked)  Constitutional: [] Weight loss  [] Fever  [] Chills Cardiac: [] Chest pain   [] Chest pressure   [] Palpitations   [] Shortness of breath when laying flat   [] Shortness of breath at rest   [] Shortness of breath with exertion. Vascular:  [] Pain in legs with walking   [] Pain in legs at rest   [] Pain in legs when laying flat   [] Claudication   [] Pain in feet when walking  [] Pain in feet at rest  [] Pain in feet when laying flat   [] History of DVT   [] Phlebitis   [] Swelling in legs   [] Varicose veins   [] Non-healing ulcers Pulmonary:   [] Uses home oxygen   [] Productive cough   [] Hemoptysis   [] Wheeze  [] COPD   [] Asthma Neurologic:  [] Dizziness  [] Blackouts   [] Seizures   [] History of stroke   [] History of TIA  [] Aphasia   [] Temporary blindness   [] Dysphagia   [] Weakness or numbness in arms   [] Weakness or numbness in legs Musculoskeletal:  [x] Arthritis   [] Joint swelling   [x] Joint pain   [] Low back pain Hematologic:  [] Easy bruising  [] Easy bleeding   [] Hypercoagulable state   [] Anemic  [] Hepatitis Gastrointestinal:  [] Blood in stool   [] Vomiting blood  [x] Gastroesophageal reflux/heartburn   [] Difficulty swallowing. Genitourinary:  [] Chronic kidney disease   [] Difficult urination  [] Frequent urination  [] Burning with urination   [] Blood in urine Skin:  [] Rashes   [] Ulcers   [] Wounds Psychological:  [] History of anxiety   []  History of major depression.  Physical Examination  Vitals:   04/03/23 0845  BP: 111/71  Pulse: 60  Resp: 16  Weight: 224 lb (101.6 kg)   Body mass index is 32.14 kg/m. Gen:  WD/WN, NAD Head: [place]/AT, No temporalis wasting. Ear/Nose/Throat: Hearing grossly intact, nares w/o erythema or drainage, trachea  midline Eyes: Conjunctiva clear. Sclera non-icteric Neck: Supple.  Left carotid bruit  Pulmonary:  Good air movement, equal and clear to auscultation bilaterally.  Cardiac: RRR, No JVD Vascular:  Vessel Right Left  Radial Palpable Palpable       Musculoskeletal: M/S 5/5 throughout.  No deformity or atrophy. No edema. Neurologic: CN 2-12 intact. Sensation grossly intact in extremities.  Symmetrical.  Speech is fluent. Motor exam as listed above. Psychiatric: Judgment intact, Mood & affect appropriate for pt's clinical situation. Dermatologic: No rashes or  ulcers noted.  No cellulitis or open wounds.     CBC Lab Results  Component Value Date   WBC 6.7 10/18/2021   HGB 14.2 10/18/2021   HCT 42.6 10/18/2021   MCV 90.8 10/18/2021   PLT 164 10/18/2021    BMET    Component Value Date/Time   NA 140 10/18/2021 0431   K 4.0 10/18/2021 0431   CL 109 10/18/2021 0431   CO2 27 10/18/2021 0431   GLUCOSE 144 (H) 10/18/2021 0431   BUN 15 10/18/2021 0431   CREATININE 1.19 10/18/2021 0431   CALCIUM 8.4 (L) 10/18/2021 0431   GFRNONAA >60 10/18/2021 0431   GFRAA >60 12/04/2017 0659   CrCl cannot be calculated (Patient's most recent lab result is older than the maximum 21 days allowed.).  COAG No results found for: "INR", "PROTIME"  Radiology No results found.   Assessment/Plan Carotid artery stenosis Carotid duplex shows a known right carotid artery occlusion and a widely patent left carotid stent.  Doing well.  We can now follow this on an annual basis.  Continue current medical regimen which includes Plavix, aspirin, and Lipitor.  Benign essential hypertension blood pressure control important in reducing the progression of atherosclerotic disease. On appropriate oral medications.     Type II diabetes mellitus with complication (HCC) blood glucose control important in reducing the progression of atherosclerotic disease. Also, involved in wound healing. On appropriate  medications.     Hyperlipidemia LDL goal <100 lipid control important in reducing the progression of atherosclerotic disease. Continue statin therapy  Festus Barren, MD  04/03/2023 9:37 AM    This note was created with Dragon medical transcription system.  Any errors from dictation are purely unintentional

## 2023-04-03 NOTE — Assessment & Plan Note (Signed)
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  Doing well.  We can now follow this on an annual basis.  Continue current medical regimen which includes Plavix, aspirin, and Lipitor.

## 2023-07-14 IMAGING — US US CAROTID DUPLEX BILAT
1 series · 13 of 24 positions shown · non-contrast
Comparison: 02/23/2019

CLINICAL DATA: 75-year-old male with a history of bruit. Known
right carotid occlusion

EXAM:
BILATERAL CAROTID DUPLEX ULTRASOUND
TECHNIQUE: Gray scale imaging, color Doppler and duplex ultrasound were
performed of bilateral carotid and vertebral arteries in the neck.

[Series 1: us carotid duplex bilat · 0.04mm/px · 13 of 58 slices shown]
[im 1/58]
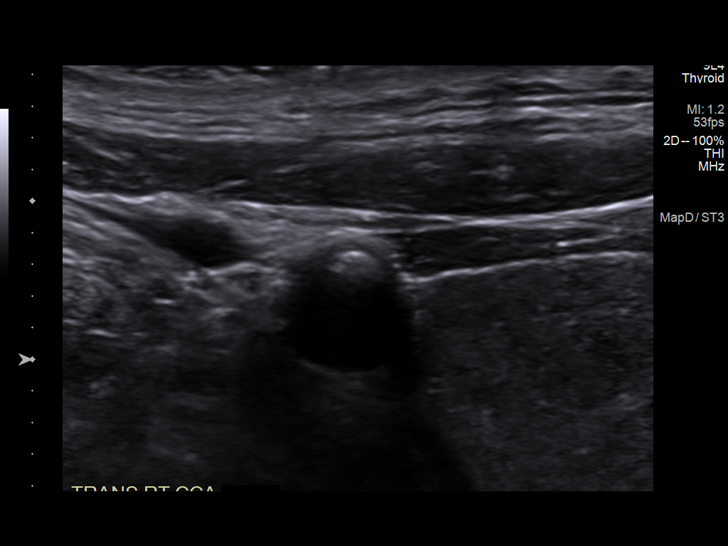
[im 5/58]
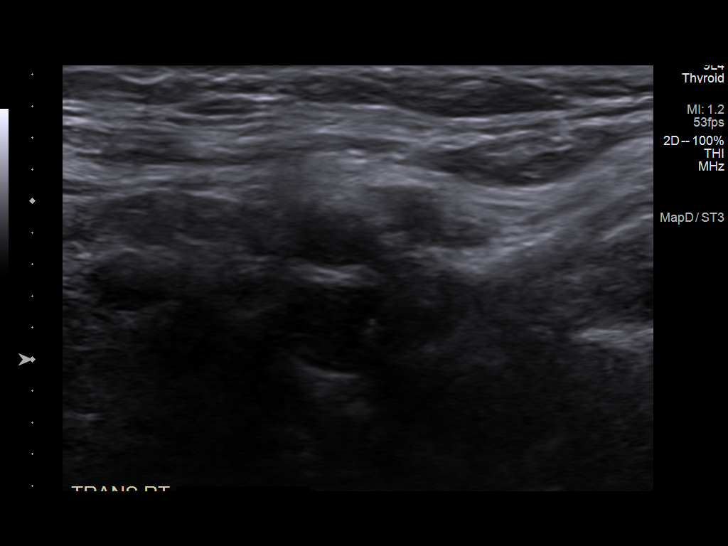
[im 10/58]
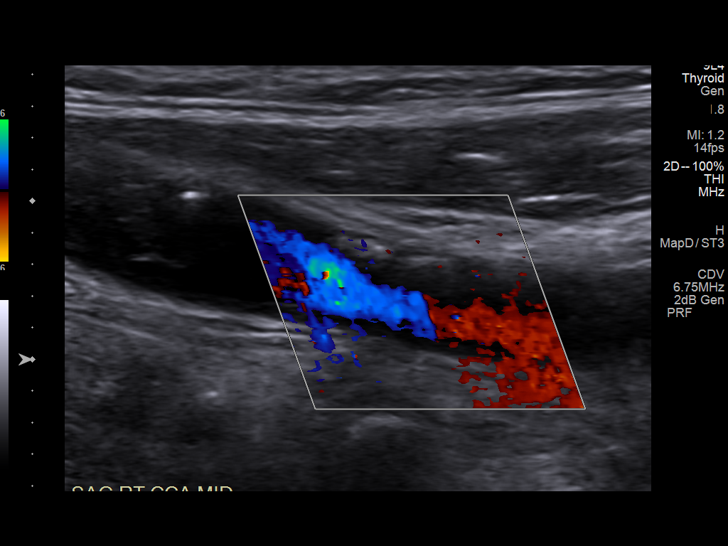
[im 15/58]
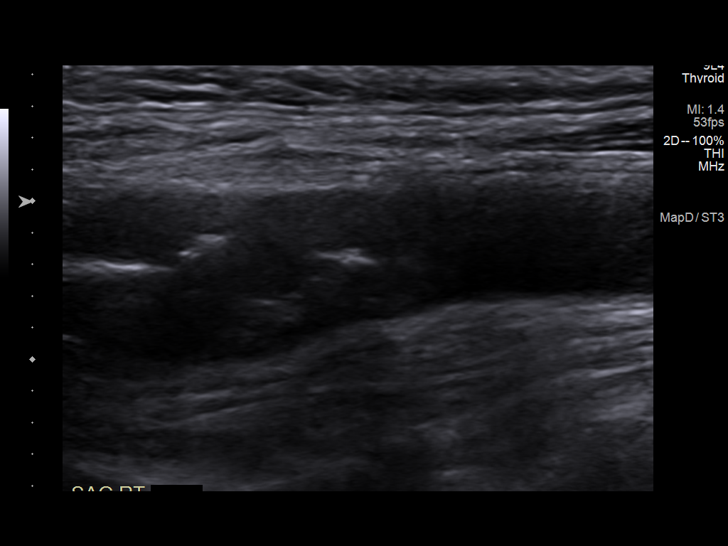
[im 20/58]
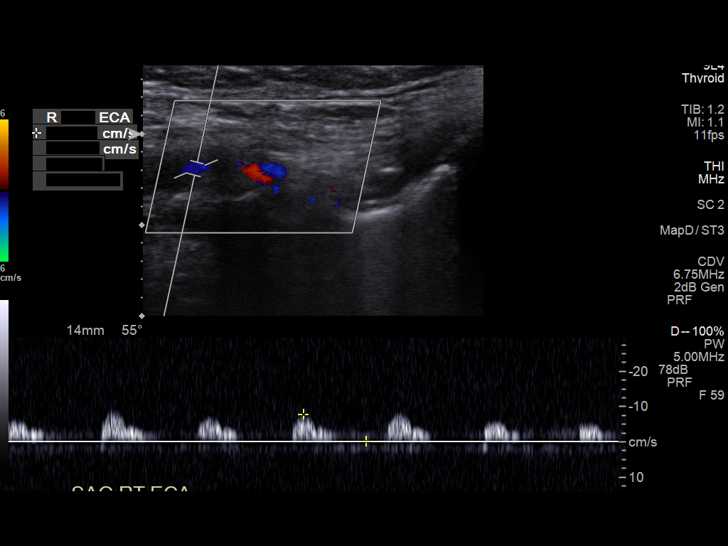
[im 25/58]
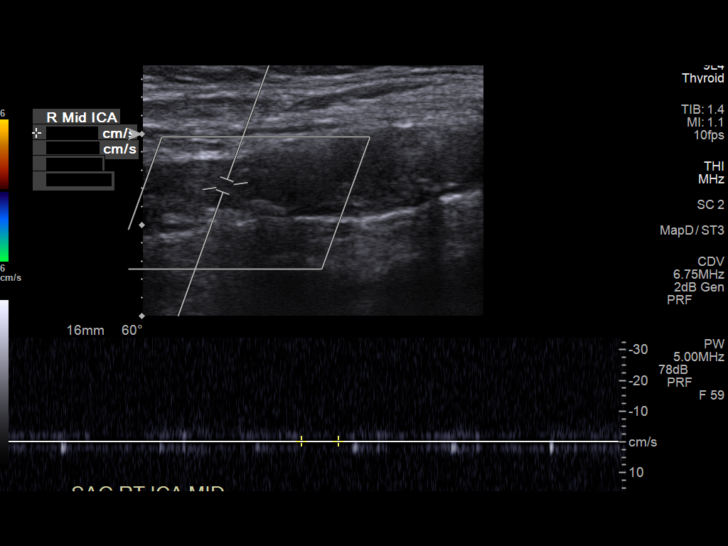
[im 30/58]
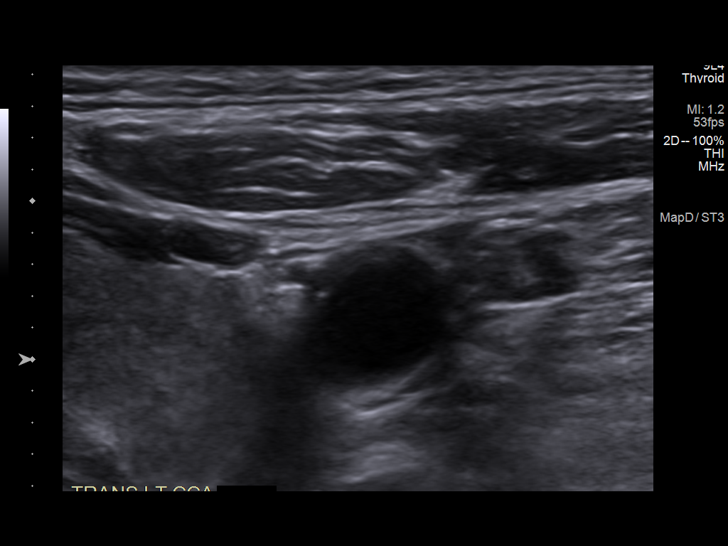
[im 33/58]
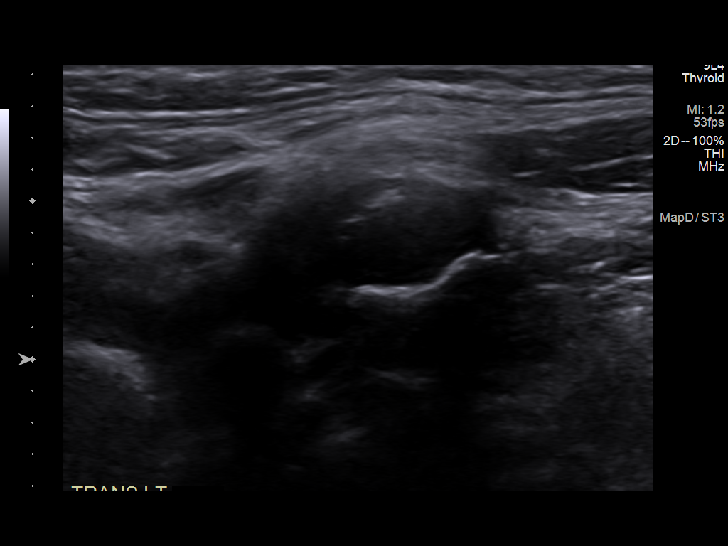
[im 38/58]
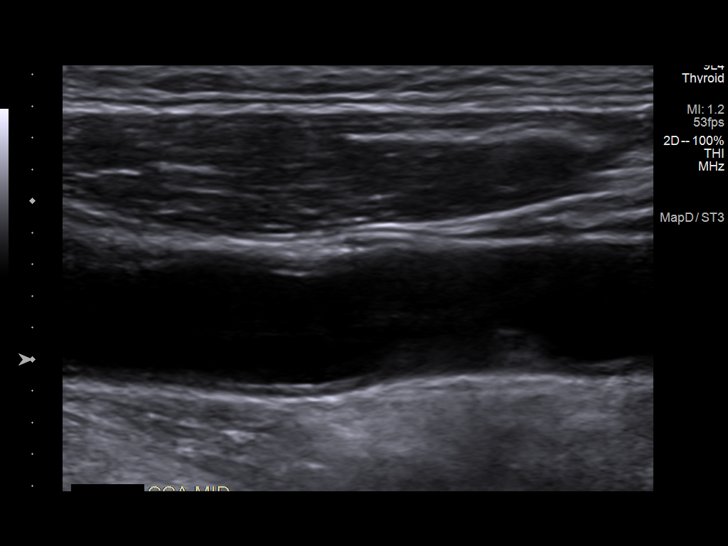
[im 43/58]
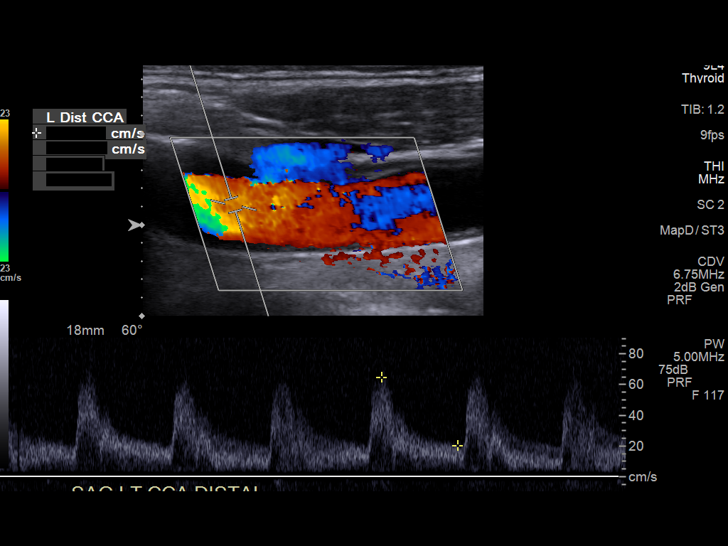
[im 48/58]
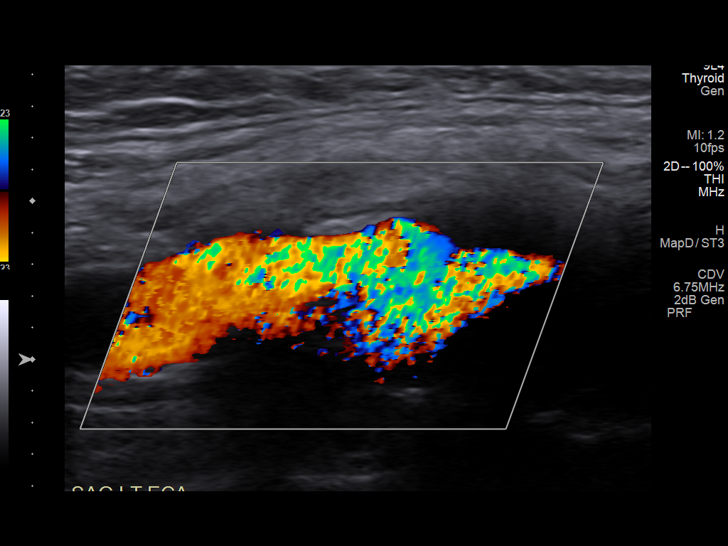
[im 53/58]
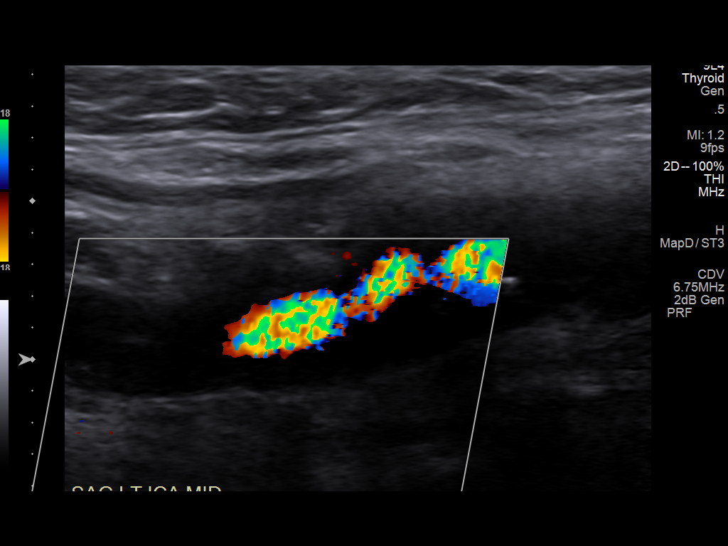
[im 58/58]
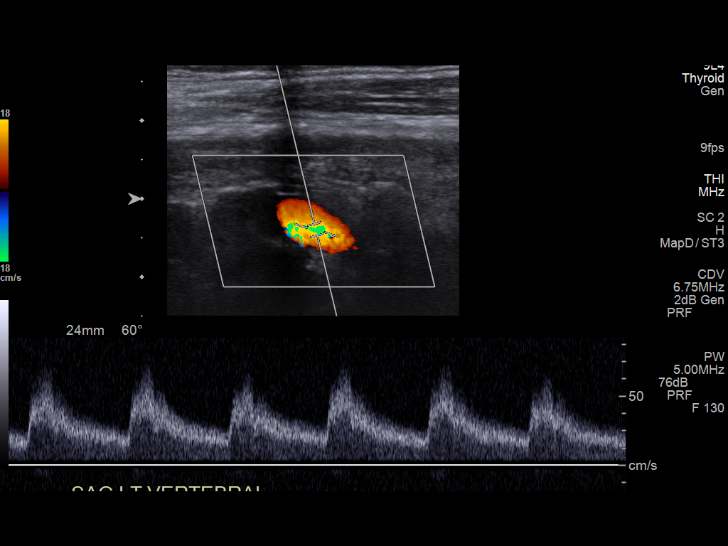

[13 of 24 positions shown; findings below may reference images not displayed]

FINDINGS: Criteria: Quantification of carotid stenosis is based on velocity
parameters that correlate the residual internal carotid diameter
with NASCET-based stenosis levels, using the diameter of the distal
internal carotid lumen as the denominator for stenosis measurement.

The following velocity measurements were obtained:

RIGHT

ICA: Occlusion

CCA: 61 cm per second

SYSTOLIC ICA/CCA RATIO:  N/a

ECA:  8 cm/sec

LEFT

ICA:  Systolic 339 cm/sec, Diastolic 37 cm/sec

CCA:  107 cm/sec

SYSTOLIC ICA/CCA RATIO:

ECA:  144 cm/sec

Right Brachial SBP: 163

Left Brachial SBP: 115

RIGHT CAROTID ARTERY: Atherosclerotic changes of the common carotid
artery. High resistance waveform. Heterogeneous and partially
calcified plaque at the right carotid bifurcation, with persistent
occlusion of the ICA. RIGHT VERTEBRAL ARTERY: Vertebral artery flow
is reversed

LEFT CAROTID ARTERY: Atherosclerotic changes of the left common
carotid artery. Intermediate waveform maintained. Moderate
heterogeneous and partially calcified plaque at the left carotid
bifurcation. Lumen shadowing present. Low resistance waveform of the
left ICA. No significant tortuosity.

LEFT VERTEBRAL ARTERY:  Antegrade flow with low resistance waveform.
IMPRESSION: Right:

Persisting occlusion of the right ICA.

Left:

Heterogeneous and partially calcified plaque at the left carotid
bifurcation, with discordant results regarding degree of stenosis by
established duplex criteria. Peak velocity suggests 70% - 99%
stenosis, with the ICA/ CCA ratio suggesting a lesser degree of
stenosis. If establishing a more accurate degree of stenosis is
required, cerebral angiogram should be considered, or as a second
best test, CTA.

Sampling of the right vertebral artery demonstrates reversal of
flow. This is not concordant with the upper extremity blood
pressures, which is measured to be greater on the right than the
left. Repeat office based upper extremity blood pressure measurement
may be useful, as well as consideration of CT angiogram and/or
cerebral angiogram.

## 2023-09-17 ENCOUNTER — Emergency Department (HOSPITAL_COMMUNITY)
Admission: EM | Admit: 2023-09-17 | Discharge: 2023-09-17 | Disposition: A | Discharge status: Home / Self Care | Attending: Emergency Medicine | Admitting: Emergency Medicine

## 2023-09-17 ENCOUNTER — Other Ambulatory Visit: Payer: Self-pay

## 2023-09-17 DIAGNOSIS — Y9301 Activity, walking, marching and hiking: Secondary | ICD-10-CM | POA: Insufficient documentation

## 2023-09-17 DIAGNOSIS — W450XXA Nail entering through skin, initial encounter: Secondary | ICD-10-CM | POA: Insufficient documentation

## 2023-09-17 DIAGNOSIS — Z7982 Long term (current) use of aspirin: Secondary | ICD-10-CM | POA: Insufficient documentation

## 2023-09-17 DIAGNOSIS — I2510 Atherosclerotic heart disease of native coronary artery without angina pectoris: Secondary | ICD-10-CM | POA: Insufficient documentation

## 2023-09-17 DIAGNOSIS — Z7984 Long term (current) use of oral hypoglycemic drugs: Secondary | ICD-10-CM | POA: Insufficient documentation

## 2023-09-17 DIAGNOSIS — E119 Type 2 diabetes mellitus without complications: Secondary | ICD-10-CM | POA: Insufficient documentation

## 2023-09-17 DIAGNOSIS — Z7902 Long term (current) use of antithrombotics/antiplatelets: Secondary | ICD-10-CM | POA: Diagnosis not present

## 2023-09-17 DIAGNOSIS — S91331A Puncture wound without foreign body, right foot, initial encounter: Secondary | ICD-10-CM | POA: Diagnosis present

## 2023-09-17 DIAGNOSIS — Z23 Encounter for immunization: Secondary | ICD-10-CM | POA: Diagnosis not present

## 2023-09-17 LAB — CBC WITH DIFFERENTIAL/PLATELET
Abs Immature Granulocytes: 0.02 10*3/uL (ref 0.00–0.07)
Basophils Absolute: 0 10*3/uL (ref 0.0–0.1)
Basophils Relative: 1 %
Eosinophils Absolute: 0.1 10*3/uL (ref 0.0–0.5)
Eosinophils Relative: 1 %
HCT: 43.8 % (ref 39.0–52.0)
Hemoglobin: 14.6 g/dL (ref 13.0–17.0)
Immature Granulocytes: 0 %
Lymphocytes Relative: 29 %
Lymphs Abs: 1.9 10*3/uL (ref 0.7–4.0)
MCH: 29.6 pg (ref 26.0–34.0)
MCHC: 33.3 g/dL (ref 30.0–36.0)
MCV: 88.8 fL (ref 80.0–100.0)
Monocytes Absolute: 0.7 10*3/uL (ref 0.1–1.0)
Monocytes Relative: 11 %
Neutro Abs: 3.8 10*3/uL (ref 1.7–7.7)
Neutrophils Relative %: 58 %
Platelets: 254 10*3/uL (ref 150–400)
RBC: 4.93 MIL/uL (ref 4.22–5.81)
RDW: 14.6 % (ref 11.5–15.5)
WBC: 6.6 10*3/uL (ref 4.0–10.5)
nRBC: 0 % (ref 0.0–0.2)

## 2023-09-17 LAB — COMPREHENSIVE METABOLIC PANEL WITH GFR
ALT: 15 U/L (ref 0–44)
AST: 21 U/L (ref 15–41)
Albumin: 3.7 g/dL (ref 3.5–5.0)
Alkaline Phosphatase: 97 U/L (ref 38–126)
Anion gap: 10 (ref 5–15)
BUN: 17 mg/dL (ref 8–23)
CO2: 22 mmol/L (ref 22–32)
Calcium: 8.9 mg/dL (ref 8.9–10.3)
Chloride: 106 mmol/L (ref 98–111)
Creatinine, Ser: 1.24 mg/dL (ref 0.61–1.24)
GFR, Estimated: 59 mL/min — ABNORMAL LOW (ref >60)
Glucose, Bld: 111 mg/dL — ABNORMAL HIGH (ref 70–99)
Potassium: 4 mmol/L (ref 3.5–5.1)
Sodium: 138 mmol/L (ref 135–145)
Total Bilirubin: 0.8 mg/dL (ref 0.0–1.2)
Total Protein: 7.3 g/dL (ref 6.5–8.1)

## 2023-09-17 MED ORDER — DOXYCYCLINE HYCLATE 100 MG PO CAPS: 100.0000 mg | ORAL_CAPSULE | Freq: Two times a day (BID) | ORAL | 0 refills | Status: DC

## 2023-09-17 MED ORDER — TETANUS-DIPHTH-ACELL PERTUSSIS 5-2.5-18.5 LF-MCG/0.5 IM SUSY
0.5000 mL | PREFILLED_SYRINGE | Freq: Once | INTRAMUSCULAR | Status: AC
  Administered 2023-09-17: 0.5 mL via INTRAMUSCULAR
  Filled 2023-09-17: qty 0.5

## 2023-09-17 NOTE — ED Provider Notes (Signed)
 [place] EMERGENCY DEPARTMENT AT [facility] [place] Provider Note   CSN: [id number] Arrival date & time: 09/17/23  1235     History  Chief Complaint  Patient presents with   Cellulitis    Right foot     Brendan Callahan is a 78 y.o. male with past medical history significant for DM, CAD, GERD, peripheral neuropathy who presents with concern for laceration/wound to the right foot.  He reports that he was walking around with a nail protruding through his shoe for he does not know how long, he just noticed it yesterday.  He was seen by his primary care doctor and was sent here for antibiotics and tetanus and for further evaluation given his history of diabetes to assess his diabetic foot wound.  He denies any fever, chills.  He denies any pus from the affected site.  HPI     Home Medications Prior to Admission medications   Medication Sig Start Date End Date Taking? Authorizing Provider  doxycycline (VIBRAMYCIN) 100 MG capsule Take 1 capsule (100 mg total) by mouth 2 (two) times daily. 09/17/23  Yes Kieth Hartis H, PA-C  allopurinol (ZYLOPRIM) 300 MG tablet Take 300 mg by mouth daily.    [provider]  aspirin EC 81 MG EC tablet Take 1 tablet (81 mg total) by mouth daily at 6 (six) AM. Swallow whole. 10/19/21   Georgiana Spinner, NP  atorvastatin (LIPITOR) 20 MG tablet Take 40 mg by mouth daily.    [provider]  Boswellia-Glucosamine-Vit D (OSTEO BI-FLEX ONE PER DAY PO) Take by mouth daily.    [provider]  clopidogrel (PLAVIX) 75 MG tablet Take 75 mg by mouth daily.    [provider]  gabapentin (NEURONTIN) 300 MG capsule Take 300 mg by mouth daily.    [provider]  hydrocortisone 2.5 % lotion Apply topically daily. 11/29/21   [provider]  meclizine (ANTIVERT) 25 MG tablet Take 25 mg by mouth 2 (two) times daily as needed. 12/20/21   [provider]  MEGARED OMEGA-3 KRILL OIL PO Take by mouth daily.     [provider]  metFORMIN (GLUCOPHAGE) 500 MG tablet Take 500 mg by mouth 2 (two) times daily.    [provider]  metoprolol succinate (TOPROL-XL) 50 MG 24 hr tablet Take 50 mg by mouth daily.    [provider]  Multiple Vitamin (MULTIVITAMIN WITH MINERALS) TABS tablet Take 1 tablet by mouth daily.    [provider]  oxyCODONE-acetaminophen (PERCOCET/ROXICET) 5-325 MG tablet Take 1 tablet by mouth every 6 (six) hours as needed for moderate pain. Patient not taking: Reported on 02/25/2022 10/18/21   Georgiana Spinner, NP  pantoprazole (PROTONIX) 20 MG tablet Take 20 mg by mouth daily.    [provider]  sildenafil (VIAGRA) 100 MG tablet Take 100 mg by mouth as directed. Patient not taking: Reported on 02/25/2022 08/02/21   [provider]  TRUE METRIX BLOOD GLUCOSE TEST test strip SMARTSIG:Via Meter 06/05/21   [provider]  TRUEplus Lancets 33G MISC  06/05/21   [provider]      Allergies    Patient has no known allergies.    Review of Systems   Review of Systems  All other systems reviewed and are negative.   Physical Exam Updated Vital Signs BP 108/76 (BP Location: Left Arm)   Pulse 77   Temp 97.8 F (36.6 C) (Oral)   Resp 18   Wt  97.5 kg   SpO2 95%   BMI 30.85 kg/m  Physical Exam Vitals and nursing note reviewed.  Constitutional:      General: He is not in acute distress.    Appearance: Normal appearance.  HENT:     Head: Normocephalic and atraumatic.  Eyes:     General:        Right eye: No discharge.        Left eye: No discharge.  Cardiovascular:     Rate and Rhythm: Normal rate and regular rhythm.     Pulses: Normal pulses.     Heart sounds: No murmur heard.    No friction rub. No gallop.     Comments: DP, PT pulses 2+ Pulmonary:     Effort: Pulmonary effort is normal.     Breath sounds: Normal breath sounds.  Abdominal:     General: Bowel sounds are normal.     Palpations:  Abdomen is soft.  Skin:    General: Skin is warm and dry.     Capillary Refill: Capillary refill takes less than 2 seconds.     Comments: 2cm laceration with no bleeding, serous drainage, some surrounding redness, but no induration. Mild soft tissue swelling of the foot.  Neurological:     Mental Status: He is alert and oriented to person, place, and time.  Psychiatric:        Mood and Affect: Mood normal.        Behavior: Behavior normal.     ED Results / Procedures / Treatments   Labs (all labs ordered are listed, but only abnormal results are displayed) Labs Reviewed  COMPREHENSIVE METABOLIC PANEL WITH GFR - Abnormal; Notable for the following components:      Result Value   Glucose, Bld 111 (*)    GFR, Estimated 59 (*)    All other components within normal limits  CBC WITH DIFFERENTIAL/PLATELET    EKG None  Radiology No results found.  Procedures Procedures    Medications Ordered in ED Medications  Tdap (BOOSTRIX) injection 0.5 mL (has no administration in time range)    ED Course/ Medical Decision Making/ A&P                                 Medical Decision Making Amount and/or Complexity of Data Reviewed Labs: ordered.  Risk Prescription drug management.   This patient is a 78 y.o. male  who presents to the ED for concern of diabetic foot wound.   Differential diagnoses prior to evaluation: The emergent differential diagnosis includes, but is not limited to,  cellulitis, osteomyelitis, foreign body, bacteremia, sepsis, vs other . This is not an exhaustive differential.   Past Medical History / Co-morbidities / Social History: DM, CAD, GERD, peripheral neuropathy   Additional history: Chart reviewed. Pertinent results include: Reviewed outpatient family medicine visits, previous vascular surgery visits  Physical Exam: Physical exam performed. The pertinent findings include: 2cm laceration with no bleeding, serous drainage, some surrounding redness,  but no induration. Mild soft tissue swelling of the foot.  DP, PT pulses 2+  Lab Tests/Imaging studies: I personally interpreted labs/imaging and the pertinent results include: CBC unremarkable, CMP unremarkable.  Medications: Tetanus updated in the emergency department, will plan to discharge patient on doxycycline to cover for diabetic foot infection, there is some developing redness, soft tissue swelling concerning for developing infection but overall for the most part at this time I  see a appropriately healing wound on the bottom of the foot with some serous drainage.  Discussed with patient that given his peripheral neuropathy and history of diabetes he is at risk for wound complications but at this time I see no evidence of significant wound complications, will plan for close PCP follow-up for wound recheck, antibiotics, encouraged patient to keep it clean, dry, covered.   Disposition: After consideration of the diagnostic results and the patients response to treatment, I feel that patient stable for discharge with plan as above.   emergency department workup does not suggest an emergent condition requiring admission or immediate intervention beyond what has been performed at this time. The plan is: as above. The patient is safe for discharge and has been instructed to return immediately for worsening symptoms, change in symptoms or any other concerns.  Final Clinical Impression(s) / ED Diagnoses Final diagnoses:  Puncture wound of right foot, initial encounter    Rx / DC Orders ED Discharge Orders          Ordered    doxycycline (VIBRAMYCIN) 100 MG capsule  2 times daily        09/17/23 1520              Whit Bruni, [place], PA-C 09/17/23 1542    Lorre Nick, MD 09/17/23 1557

## 2023-09-17 NOTE — Discharge Instructions (Addendum)
 Entire course of antibiotics as prescribed and follow-up closely with your primary care doctor to monitor for any signs of nonhealing wound, developing infection, versus other.  Please return to the emergency department if you have significantly worsening pain, swelling, redness, or pus draining from the affected site.   I would clean the wound with soap thoroughly once daily, and wear a bandage to protect the wound from and bacteria in socks and shoes.

## 2023-09-17 NOTE — ED Triage Notes (Signed)
 Pt states that he noticed that his right foot was red and swollen. Pt has diabetic neuropathy.

## 2023-10-22 ENCOUNTER — Other Ambulatory Visit: Payer: Self-pay

## 2023-10-22 ENCOUNTER — Emergency Department (HOSPITAL_COMMUNITY)
Admission: EM | Admit: 2023-10-22 | Discharge: 2023-10-22 | Disposition: A | Discharge status: Home / Self Care | Attending: Emergency Medicine | Admitting: Emergency Medicine

## 2023-10-22 DIAGNOSIS — L089 Local infection of the skin and subcutaneous tissue, unspecified: Secondary | ICD-10-CM | POA: Diagnosis present

## 2023-10-22 DIAGNOSIS — Z7901 Long term (current) use of anticoagulants: Secondary | ICD-10-CM | POA: Diagnosis not present

## 2023-10-22 DIAGNOSIS — Z7982 Long term (current) use of aspirin: Secondary | ICD-10-CM | POA: Insufficient documentation

## 2023-10-22 MED ORDER — CEPHALEXIN 500 MG PO CAPS
500.0000 mg | ORAL_CAPSULE | Freq: Once | ORAL | Status: AC
  Administered 2023-10-22: 500 mg via ORAL
  Filled 2023-10-22: qty 1

## 2023-10-22 MED ORDER — CEPHALEXIN 500 MG PO CAPS: 500.0000 mg | ORAL_CAPSULE | Freq: Two times a day (BID) | ORAL | 0 refills | Status: AC

## 2023-10-22 NOTE — ED Provider Notes (Signed)
 [place] EMERGENCY DEPARTMENT AT [facility] Provider Note   CSN: [id number] Arrival date & time: 10/22/23  4010     History  Chief Complaint  Patient presents with   Foot Pain    Brendan Callahan is a 78 y.o. male.  HPI Patient presents with concern of right foot redness and swelling.  About a month ago the patient stepped on a nail, went through his shoe, into his foot.  He was seen, evaluated, received tetanus, antibiotics.  He notes that he has been doing generally better.  Yesterday with persistent mild erythema primarily on the dorsal surface of the foot he went to urgent care.  He was encouraged to go to emergency department from urgent care, and today presents for evaluation.  He notes that since yesterday the redness has diminished, the swelling is diminished, and he continues to deny any systemic complaints including lack of sensation beyond baseline neuropathy, fever, chills, nausea, vomiting. X-rays performed yesterday at [facility] urgent care, prior to discharge.    Home Medications Prior to Admission medications   Medication Sig Start Date End Date Taking? Authorizing Provider  cephALEXin (KEFLEX) 500 MG capsule Take 1 capsule (500 mg total) by mouth 2 (two) times daily for 10 days. 10/22/23 11/01/23 Yes Dorenda Gandy, MD  allopurinol  (ZYLOPRIM ) 300 MG tablet Take 300 mg by mouth daily.    [provider]  aspirin  EC 81 MG EC tablet Take 1 tablet (81 mg total) by mouth daily at 6 (six) AM. Swallow whole. 10/19/21   Brown, Fallon E, NP  atorvastatin  (LIPITOR) 20 MG tablet Take 40 mg by mouth daily.    [provider]  Boswellia-Glucosamine-Vit D (OSTEO BI-FLEX ONE PER DAY PO) Take by mouth daily.    [provider]  clopidogrel  (PLAVIX ) 75 MG tablet Take 75 mg by mouth daily.    [provider]  doxycycline  (VIBRAMYCIN ) 100 MG capsule Take 1 capsule (100 mg total) by mouth 2 (two) times daily. 09/17/23   Prosperi, Christian H, PA-C   gabapentin  (NEURONTIN ) 300 MG capsule Take 300 mg by mouth daily.    [provider]  hydrocortisone 2.5 % lotion Apply topically daily. 11/29/21   [provider]  meclizine (ANTIVERT) 25 MG tablet Take 25 mg by mouth 2 (two) times daily as needed. 12/20/21   [provider]  MEGARED OMEGA-3 KRILL OIL PO Take by mouth daily.    [provider]  metFORMIN (GLUCOPHAGE) 500 MG tablet Take 500 mg by mouth 2 (two) times daily.    [provider]  metoprolol  succinate (TOPROL -XL) 50 MG 24 hr tablet Take 50 mg by mouth daily.    [provider]  Multiple Vitamin (MULTIVITAMIN WITH MINERALS) TABS tablet Take 1 tablet by mouth daily.    [provider]  oxyCODONE -acetaminophen  (PERCOCET/ROXICET) 5-325 MG tablet Take 1 tablet by mouth every 6 (six) hours as needed for moderate pain. Patient not taking: Reported on 02/25/2022 10/18/21   Brown, Fallon E, NP  pantoprazole  (PROTONIX ) 20 MG tablet Take 20 mg by mouth daily.    [provider]  sildenafil  (VIAGRA ) 100 MG tablet Take 100 mg by mouth as directed. Patient not taking: Reported on 02/25/2022 08/02/21   [provider]  TRUE METRIX BLOOD GLUCOSE TEST test strip SMARTSIG:Via Meter 06/05/21   [provider]  TRUEplus Lancets 33G MISC  06/05/21   [provider]      Allergies    Patient has no known allergies.  Review of Systems   Review of Systems  Physical Exam Updated Vital Signs BP (!) 141/88 (BP Location: Left Arm)   Pulse 67   Temp 97.7 F (36.5 C) (Oral)   Resp 18   SpO2 96%  Physical Exam Vitals and nursing note reviewed.  Constitutional:      General: He is not in acute distress.    Appearance: He is well-developed.  HENT:     Head: Normocephalic and atraumatic.  Eyes:     Conjunctiva/sclera: Conjunctivae normal.  Cardiovascular:     Rate and Rhythm: Normal rate and regular rhythm.     Pulses: Normal pulses.  Pulmonary:      Effort: Pulmonary effort is normal. No respiratory distress.     Breath sounds: No stridor.  Abdominal:     General: There is no distension.  Musculoskeletal:       Legs:  Skin:    General: Skin is warm and dry.  Neurological:     Mental Status: He is alert and oriented to person, place, and time.     ED Results / Procedures / Treatments   Labs (all labs ordered are listed, but only abnormal results are displayed) Labs Reviewed - No data to display  EKG None  Radiology No results found.  Procedures Procedures    Medications Ordered in ED Medications  cephALEXin (KEFLEX) capsule 500 mg (has no administration in time range)    ED Course/ Medical Decision Making/ A&P                                 Medical Decision Making Adult male presents 1 month after sustaining a puncture wound, now with concern for ongoing erythema.  Absence of systemic complaints reassuring for low suspicion of bacteremia, sepsis.  Patient's x-ray interpretation from yesterday, reviewed images not available as it was a different health system.  With preserved neurovascular status compared to baseline, low suspicion for deep space infection, abscess.  Low suspicion for osteomyelitis, though this is a possibility.  Keflex started, patient will follow-up with primary care.  Amount and/or Complexity of Data Reviewed External Data Reviewed: notes.    Details: Urgent care notes interpretation from the physician  Risk Prescription drug management.  Final Clinical Impression(s) / ED Diagnoses Final diagnoses:  Right foot infection    Rx / DC Orders ED Discharge Orders          Ordered    cephALEXin (KEFLEX) 500 MG capsule  2 times daily        10/22/23 1038              Dorenda Gandy, MD 10/22/23 1043

## 2023-10-22 NOTE — Discharge Instructions (Signed)
 Today's evaluation has been generally reassuring.  There is some mild evidence for cellulitis or skin infection, less evidence for osteomyelitis or bone involvement.  The antibiotic you are starting is appropriate for both of these, and requires follow-up with your physician within 1 week with consideration of additional evaluation possibly including MRI at that point if you have failed to improve.  If develop new, or concerning changes prior to seeing your physician return here.

## 2023-10-22 NOTE — ED Triage Notes (Signed)
 Right foot pain. Pt states that he stepped on a nail several weeks ago. Pt denies actual pain at this time, but wants to make sure his foot looks okay as he is a diabetic.

## 2023-10-27 ENCOUNTER — Encounter (INDEPENDENT_AMBULATORY_CARE_PROVIDER_SITE_OTHER): Payer: Self-pay

## 2024-03-07 ENCOUNTER — Telehealth (INDEPENDENT_AMBULATORY_CARE_PROVIDER_SITE_OTHER): Payer: Self-pay | Admitting: Vascular Surgery

## 2024-03-07 NOTE — Telephone Encounter (Signed)
 Received a referral for carotid stenosis from Dr. Auston. LVM for pt advising that he has an appt on 10.24.25 for a carotid US  and follow up with Dr. Marea. Inquired if something new was going on and he needed to be seen sooner or if he wants to stay with his yearly follow up already scheduled. I advised to call office and let us  know if appt needs to be moved up.

## 2024-03-07 NOTE — Telephone Encounter (Signed)
 Received call back from pt - he states that keeping hi sappt on 10.24.25 with Dr. Marea is what he wants to do. Nothing new happening.

## 2024-03-31 ENCOUNTER — Other Ambulatory Visit: Payer: Self-pay | Admitting: Otolaryngology

## 2024-03-31 ENCOUNTER — Inpatient Hospital Stay
Admission: RE | Admit: 2024-03-31 | Discharge: 2024-03-31 | Disposition: A | Discharge status: Home / Self Care | Payer: Self-pay | Source: Ambulatory Visit | Attending: Otolaryngology | Admitting: Otolaryngology

## 2024-03-31 DIAGNOSIS — R221 Localized swelling, mass and lump, neck: Secondary | ICD-10-CM

## 2024-04-01 ENCOUNTER — Ambulatory Visit (INDEPENDENT_AMBULATORY_CARE_PROVIDER_SITE_OTHER): Payer: Medicare (Managed Care) | Admitting: Vascular Surgery

## 2024-04-01 ENCOUNTER — Ambulatory Visit (INDEPENDENT_AMBULATORY_CARE_PROVIDER_SITE_OTHER): Payer: Medicare (Managed Care)

## 2024-04-01 ENCOUNTER — Encounter (INDEPENDENT_AMBULATORY_CARE_PROVIDER_SITE_OTHER): Payer: Self-pay | Admitting: Vascular Surgery

## 2024-04-01 VITALS — BP 134/76 | HR 60 | Resp 18 | Ht 70.0 in | Wt 219.0 lb

## 2024-04-01 DIAGNOSIS — E785 Hyperlipidemia, unspecified: Secondary | ICD-10-CM

## 2024-04-01 DIAGNOSIS — E118 Type 2 diabetes mellitus with unspecified complications: Secondary | ICD-10-CM | POA: Diagnosis not present

## 2024-04-01 DIAGNOSIS — I10 Essential (primary) hypertension: Secondary | ICD-10-CM | POA: Diagnosis not present

## 2024-04-01 DIAGNOSIS — I6523 Occlusion and stenosis of bilateral carotid arteries: Secondary | ICD-10-CM

## 2024-04-01 NOTE — Progress Notes (Signed)
 MRN : [id number]  Brendan Callahan is a 78 y.o. (1945/06/23) male who presents with chief complaint of  Chief Complaint  Patient presents with   Follow-up    1 year carotid  .  History of Present Illness: Patient returns in follow-up of his carotid disease.  He is status post left carotid stent placement for high-grade stenosis with previous TIA and a known right carotid occlusion.  He is doing well.  He denies any focal neurologic symptoms. Specifically, the patient denies amaurosis fugax, speech or swallowing difficulties, or arm or leg weakness or numbness. 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  Current Outpatient Medications  Medication Sig Dispense Refill   allopurinol  (ZYLOPRIM ) 300 MG tablet Take 300 mg by mouth daily.     atorvastatin  (LIPITOR) 20 MG tablet Take 40 mg by mouth daily.     clopidogrel  (PLAVIX ) 75 MG tablet Take 75 mg by mouth daily.     gabapentin  (NEURONTIN ) 300 MG capsule Take 300 mg by mouth daily.     hydrocortisone 2.5 % lotion Apply topically daily.     meclizine (ANTIVERT) 25 MG tablet Take 25 mg by mouth 2 (two) times daily as needed.     metFORMIN (GLUCOPHAGE) 500 MG tablet Take 500 mg by mouth 2 (two) times daily.     metoprolol  succinate (TOPROL -XL) 50 MG 24 hr tablet Take 50 mg by mouth daily.     pantoprazole  (PROTONIX ) 20 MG tablet Take 20 mg by mouth daily.     TRUE METRIX BLOOD GLUCOSE TEST test strip SMARTSIG:Via Meter     TRUEplus Lancets 33G MISC      aspirin  EC 81 MG EC tablet Take 1 tablet (81 mg total) by mouth daily at 6 (six) AM. Swallow whole. (Patient not taking: Reported on 04/01/2024) 30 tablet 11   Boswellia-Glucosamine-Vit D (OSTEO BI-FLEX ONE PER DAY PO) Take by mouth daily. (Patient not taking: Reported on 04/01/2024)     doxycycline  (VIBRAMYCIN ) 100 MG capsule Take 1 capsule (100 mg total) by mouth 2 (two) times daily. (Patient not taking: Reported on 04/01/2024) 14  capsule 0   MEGARED OMEGA-3 KRILL OIL PO Take by mouth daily. (Patient not taking: Reported on 04/01/2024)     Multiple Vitamin (MULTIVITAMIN WITH MINERALS) TABS tablet Take 1 tablet by mouth daily. (Patient not taking: Reported on 04/01/2024)     oxyCODONE -acetaminophen  (PERCOCET/ROXICET) 5-325 MG tablet Take 1 tablet by mouth every 6 (six) hours as needed for moderate pain. (Patient not taking: Reported on 02/25/2022) 20 tablet 0   sildenafil  (VIAGRA ) 100 MG tablet Take 100 mg by mouth as directed. (Patient not taking: Reported on 02/25/2022)     No current facility-administered medications for this visit.    Past Medical History:  Diagnosis Date   Acute diverticulitis 12/02/2017   GERD (gastroesophageal reflux disease)    Gout    on daily RX (12/02/2017)   High cholesterol    History of kidney stones    Hypertension    Type II diabetes mellitus (HCC)    Vertigo    none in over 1 year   Wears dentures    full upper and lower    Past Surgical History:  Procedure Laterality Date   BLADDER STONE REMOVAL  ~ 2004   CAROTID PTA/STENT INTERVENTION Left 10/17/2021   Procedure: CAROTID PTA/STENT INTERVENTION;  Surgeon: Marea Selinda RAMAN, MD;  Location: [facility];  Service: Cardiovascular;  Laterality: Left;   CATARACT EXTRACTION W/PHACO Right 03/18/2022   Procedure: CATARACT EXTRACTION PHACO AND INTRAOCULAR LENS PLACEMENT (IOC) RIGHT;  Surgeon: Jaye Fallow, MD;  Location: [facility];  Service: Ophthalmology;  Laterality: Right;  8.18 0:52.5   CATARACT EXTRACTION W/PHACO Left 03/04/2022   Procedure: CATARACT EXTRACTION PHACO AND INTRAOCULAR LENS PLACEMENT (IOC) LEFT 9.82 01:02.2;  Surgeon: Jaye Fallow, MD;  Location: [facility];  Service: Ophthalmology;  Laterality: Left;  Diabetic   CORONARY ARTERY BYPASS GRAFT  2001   CABG X3   TONSILLECTOMY       Social History   Tobacco Use   Smoking status: Former    Current packs/day: 0.00    Average  packs/day: 1 pack/day for 20.0 years (20.0 ttl pk-yrs)    Types: Cigarettes    Start date: 06/09/1958    Quit date: 06/09/1978    Years since quitting: 45.8   Smokeless tobacco: Never   Tobacco comments:    12/02/2017 nothing since 1980s  Vaping Use   Vaping status: Never Used  Substance Use Topics   Alcohol use: Yes    Comment: 12/02/2017 couple beers/month   Drug use: Never      Family History  Problem Relation Age of Onset   Hernia Mother    Healthy Father      No Known Allergies   REVIEW OF SYSTEMS (Negative unless checked)   Constitutional: [] Weight loss  [] Fever  [] Chills Cardiac: [] Chest pain   [] Chest pressure   [] Palpitations   [] Shortness of breath when laying flat   [] Shortness of breath at rest   [] Shortness of breath with exertion. Vascular:  [] Pain in legs with walking   [] Pain in legs at rest   [] Pain in legs when laying flat   [] Claudication   [] Pain in feet when walking  [] Pain in feet at rest  [] Pain in feet when laying flat   [] History of DVT   [] Phlebitis   [] Swelling in legs   [] Varicose veins   [] Non-healing ulcers Pulmonary:   [] Uses home oxygen   [] Productive cough   [] Hemoptysis   [] Wheeze  [] COPD   [] Asthma Neurologic:  [] Dizziness  [] Blackouts   [] Seizures   [] History of stroke   [] History of TIA  [] Aphasia   [] Temporary blindness   [] Dysphagia   [] Weakness or numbness in arms   [] Weakness or numbness in legs Musculoskeletal:  [x] Arthritis   [] Joint swelling   [x] Joint pain   [] Low back pain Hematologic:  [] Easy bruising  [] Easy bleeding   [] Hypercoagulable state   [] Anemic  [] Hepatitis Gastrointestinal:  [] Blood in stool   [] Vomiting blood  [x] Gastroesophageal reflux/heartburn   [] Difficulty swallowing. Genitourinary:  [] Chronic kidney disease   [] Difficult urination  [] Frequent urination  [] Burning with urination   [] Blood in urine Skin:  [] Rashes   [] Ulcers   [] Wounds Psychological:  [] History of anxiety   []  History of major depression.  Physical  Examination  Vitals:   04/01/24 0825  BP: 134/76  Pulse: 60  Resp: 18  Weight: 219 lb (99.3 kg)  Height: 5' 10 (1.778 m)   Body mass index is 31.42 kg/m. Gen:  WD/WN, NAD Head: [place]/AT, No temporalis wasting. Ear/Nose/Throat: Hearing grossly intact, nares w/o erythema or drainage, trachea midline Eyes: Conjunctiva clear. Sclera non-icteric Neck: Supple.  No bruit  Pulmonary:  Good air movement, equal and clear to auscultation bilaterally.  Cardiac: RRR, No JVD Vascular:  Vessel Right Left  Radial Palpable Palpable  Musculoskeletal: M/S 5/5 throughout.  No deformity or atrophy. No edema. Neurologic: CN 2-12 intact. Sensation grossly intact in extremities.  Symmetrical.  Speech is fluent. Motor exam as listed above. Psychiatric: Judgment intact, Mood & affect appropriate for pt's clinical situation. Dermatologic: No rashes or ulcers noted.  No cellulitis or open wounds.     CBC Lab Results  Component Value Date   WBC 6.6 09/17/2023   HGB 14.6 09/17/2023   HCT 43.8 09/17/2023   MCV 88.8 09/17/2023   PLT 254 09/17/2023    BMET    Component Value Date/Time   NA 138 09/17/2023 1258   K 4.0 09/17/2023 1258   CL 106 09/17/2023 1258   CO2 22 09/17/2023 1258   GLUCOSE 111 (H) 09/17/2023 1258   BUN 17 09/17/2023 1258   CREATININE 1.24 09/17/2023 1258   CALCIUM  8.9 09/17/2023 1258   GFRNONAA 59 (L) 09/17/2023 1258   GFRAA >60 12/04/2017 0659   CrCl cannot be calculated (Patient's most recent lab result is older than the maximum 21 days allowed.).  COAG No results found for: INR, PROTIME  Radiology No results found.   Assessment/Plan Carotid artery stenosis Carotid duplex shows a known right carotid artery occlusion and a widely patent left carotid stent.  Doing well.  Continue to follow this on an annual basis.  No change in medications.  Benign essential hypertension blood pressure control important in reducing the progression of atherosclerotic  disease. On appropriate oral medications.     Type II diabetes mellitus with complication (HCC) blood glucose control important in reducing the progression of atherosclerotic disease. Also, involved in wound healing. On appropriate medications.     Hyperlipidemia LDL goal <100 lipid control important in reducing the progression of atherosclerotic disease. Continue statin therapy  Selinda Gu, MD  04/01/2024 8:48 AM    This note was created with Dragon medical transcription system.  Any errors from dictation are purely unintentional

## 2024-04-01 NOTE — Assessment & Plan Note (Signed)
 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  Doing well.  Continue to follow this on an annual basis.  No change in medications.

## 2024-04-15 ENCOUNTER — Other Ambulatory Visit: Payer: Self-pay | Admitting: Otolaryngology

## 2024-04-15 DIAGNOSIS — M7989 Other specified soft tissue disorders: Secondary | ICD-10-CM

## 2024-04-15 NOTE — Progress Notes (Signed)
 Luverne Aran, MD sent to [facility]AN [facility] for FNA vs core of nodule in midline near thyroid  isthmus seen by US . OK to do with just local.  GY

## 2024-04-22 NOTE — Progress Notes (Signed)
 Patient for US  guided soft tissue biopsy on Mon 04/25/24, I called and spoke with the patient on the phone and gave pre-procedure instructions. Pt was made aware to be here at 12:30p and check in at the [facility] registration. Pt stated understanding. Called 04/22/24

## 2024-04-25 ENCOUNTER — Ambulatory Visit
Admission: RE | Admit: 2024-04-25 | Discharge: 2024-04-25 | Disposition: A | Discharge status: Home / Self Care | Source: Ambulatory Visit | Attending: Otolaryngology | Admitting: Otolaryngology

## 2024-04-25 ENCOUNTER — Other Ambulatory Visit: Payer: Self-pay | Admitting: Otolaryngology

## 2024-04-25 DIAGNOSIS — R590 Localized enlarged lymph nodes: Secondary | ICD-10-CM | POA: Diagnosis present

## 2024-04-25 DIAGNOSIS — R896 Abnormal cytological findings in specimens from other organs, systems and tissues: Secondary | ICD-10-CM | POA: Diagnosis not present

## 2024-04-25 DIAGNOSIS — M7989 Other specified soft tissue disorders: Secondary | ICD-10-CM

## 2024-04-25 MED ORDER — LIDOCAINE HCL (PF) 1 % IJ SOLN
5.0000 mL | Freq: Once | INTRAMUSCULAR | Status: AC
  Administered 2024-04-25: 5 mL via INTRADERMAL

## 2024-04-25 NOTE — Procedures (Signed)
 Interventional Radiology Procedure Note  Procedure: US  CORE BX SUBMENTAL ADENOPATHY    Complications: None  Estimated Blood Loss:  0  Findings: 86 G CORES IN FORMALIN    M. TREVOR Brendan Gillean, MD

## 2024-04-28 LAB — SURGICAL PATHOLOGY

## 2024-05-16 ENCOUNTER — Encounter: Payer: Self-pay | Admitting: Internal Medicine

## 2024-05-16 ENCOUNTER — Inpatient Hospital Stay: Attending: Internal Medicine | Admitting: Internal Medicine

## 2024-05-16 ENCOUNTER — Inpatient Hospital Stay

## 2024-05-16 VITALS — BP 97/73 | HR 66 | Temp 97.4°F | Resp 18 | Ht 70.0 in | Wt 223.1 lb

## 2024-05-16 DIAGNOSIS — R7303 Prediabetes: Secondary | ICD-10-CM | POA: Insufficient documentation

## 2024-05-16 DIAGNOSIS — M109 Gout, unspecified: Secondary | ICD-10-CM | POA: Insufficient documentation

## 2024-05-16 DIAGNOSIS — C8511 Unspecified B-cell lymphoma, lymph nodes of head, face, and neck: Secondary | ICD-10-CM

## 2024-05-16 DIAGNOSIS — I2510 Atherosclerotic heart disease of native coronary artery without angina pectoris: Secondary | ICD-10-CM | POA: Diagnosis not present

## 2024-05-16 DIAGNOSIS — Z87891 Personal history of nicotine dependence: Secondary | ICD-10-CM | POA: Insufficient documentation

## 2024-05-16 DIAGNOSIS — Z951 Presence of aortocoronary bypass graft: Secondary | ICD-10-CM | POA: Insufficient documentation

## 2024-05-16 DIAGNOSIS — C8591 Non-Hodgkin lymphoma, unspecified, lymph nodes of head, face, and neck: Secondary | ICD-10-CM | POA: Insufficient documentation

## 2024-05-16 LAB — COMPREHENSIVE METABOLIC PANEL WITH GFR
ALT: 11 U/L (ref 0–44)
AST: 20 U/L (ref 15–41)
Albumin: 4.1 g/dL (ref 3.5–5.0)
Alkaline Phosphatase: 149 U/L — ABNORMAL HIGH (ref 38–126)
Anion gap: 12 (ref 5–15)
BUN: 20 mg/dL (ref 8–23)
CO2: 23 mmol/L (ref 22–32)
Calcium: 8.8 mg/dL — ABNORMAL LOW (ref 8.9–10.3)
Chloride: 103 mmol/L (ref 98–111)
Creatinine, Ser: 1.49 mg/dL — ABNORMAL HIGH (ref 0.61–1.24)
GFR, Estimated: 47 mL/min — ABNORMAL LOW (ref >60)
Glucose, Bld: 88 mg/dL (ref 70–99)
Potassium: 4.1 mmol/L (ref 3.5–5.1)
Sodium: 138 mmol/L (ref 135–145)
Total Bilirubin: 0.5 mg/dL (ref 0.0–1.2)
Total Protein: 7.2 g/dL (ref 6.5–8.1)

## 2024-05-16 LAB — CBC WITH DIFFERENTIAL/PLATELET
Abs Immature Granulocytes: 0.03 K/uL (ref 0.00–0.07)
Basophils Absolute: 0.1 K/uL (ref 0.0–0.1)
Basophils Relative: 1 %
Eosinophils Absolute: 0.1 K/uL (ref 0.0–0.5)
Eosinophils Relative: 1 %
HCT: 45.6 % (ref 39.0–52.0)
Hemoglobin: 15.3 g/dL (ref 13.0–17.0)
Immature Granulocytes: 0 %
Lymphocytes Relative: 17 %
Lymphs Abs: 1.8 K/uL (ref 0.7–4.0)
MCH: 28.9 pg (ref 26.0–34.0)
MCHC: 33.6 g/dL (ref 30.0–36.0)
MCV: 86.2 fL (ref 80.0–100.0)
Monocytes Absolute: 1.4 K/uL — ABNORMAL HIGH (ref 0.1–1.0)
Monocytes Relative: 13 %
Neutro Abs: 7.2 K/uL (ref 1.7–7.7)
Neutrophils Relative %: 68 %
Platelets: 198 K/uL (ref 150–400)
RBC: 5.29 MIL/uL (ref 4.22–5.81)
RDW: 15.9 % — ABNORMAL HIGH (ref 11.5–15.5)
WBC: 10.5 K/uL (ref 4.0–10.5)
nRBC: 0 % (ref 0.0–0.2)

## 2024-05-16 LAB — HEPATITIS B SURFACE ANTIGEN: Hepatitis B Surface Ag: NONREACTIVE

## 2024-05-16 LAB — HEPATITIS B CORE ANTIBODY, IGM: Hep B C IgM: NONREACTIVE

## 2024-05-16 LAB — URIC ACID: Uric Acid, Serum: 4.7 mg/dL (ref 3.7–8.6)

## 2024-05-16 LAB — LACTATE DEHYDROGENASE: LDH: 165 U/L (ref 105–235)

## 2024-05-16 LAB — HEPATITIS C ANTIBODY: HCV Ab: NONREACTIVE

## 2024-05-16 NOTE — Progress Notes (Signed)
 No questions/concerns at this time.

## 2024-05-16 NOTE — Progress Notes (Signed)
 [facility] CONSULT NOTE  Patient Care Team: Auston Reyes BIRCH, MD as PCP - General (Internal Medicine) Rennie Cindy SAUNDERS, MD as Consulting Physician (Oncology)  CHIEF COMPLAINTS/PURPOSE OF CONSULTATION: lymphoma neck  Oncology History Overview Note      1. Lymph node US - 1.7 cm [[facility]] ; Dr.Bennett- [facility]-needle/core biopsy, mid submental abnormal adenopathy :      -  ATYPICAL B-CELL INFILTRATE, SEE NOTE.      NOTE: THE BIOPSY CONSISTS OF THIN NEEDLE CORES OF LYMPHOID TISSUE WITHOUT      APPARENT NORMAL ARCHITECTURE.  THE LYMPHOCYTES APPEAR TO PERCOLATE OUTSIDE THE      CAPSULE AND WITHIN ADIPOSE TISSUE.  THE MAJORITY OF THE CELLS ARE CD20 POSITIVE      B CELLS THAT COEXPRESS BCL6 AND CD10 WHICH IS A GERMINAL CENTER/FOLLICULAR      PHENOTYPE.  THESE CELLS RANGE IN SIZE FROM SMALL CENTROCYTE LIKE CELLS TO LARGE      CENTROBLASTS LIKE CELLS.  THESE LARGER CELLS APPEAR TO BE THE MAJORITY WITH      FOCAL COALESCING/SHEETLIKE APPEARANCE.  BCL2 IS WEAKLY POSITIVE BUT NOT      DEFINITIVELY NEGATIVE AS IN A REACTIVE GERMINAL CENTER BUT NOT OVEREXPRESSED      TYPICAL OF LYMPHOMA.  THE CELLS DO NOT SHOW GOOD POLARIZATION AND THE GERMINAL      CENTERS/FOLLICLES ALSO COALESCE AND ARE EXTREMELY LARGE/GEOGRAPHIC.  THE      TYPICAL HIGH PROLIFERATION RATE OF A REACTIVE GERMINAL CENTER IS NOT PRESENT      WITH A KI-67 SHOWING A PROLIFERATION RATE OF ABOUT 15 TO 20%.  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      CD3, CD5 AND CD43 HIGHLIGHT BACKGROUND SMALL T CELLS.  CYCLIN D1 IS      APPROPRIATELY NEGATIVE.  MUM1 SHOWS SCATTERED POSITIVITY.  CD30 IS NEGATIVE.      THE OVERALL FINDINGS ARE ATYPICAL AND SUSPICIOUS FOR A LYMPHOPROLIFERATIVE      DISORDER WITH A FOLLICLE/GERMINAL CENTER IMMUNOPHENOTYPE SUCH AS FOLLICULAR      LYMPHOMA OR POSSIBLY DIFFUSE LARGE B-CELL LYMPHOMA; HOWEVER, GIVEN THE LIMITED      MATERIAL AND ABSENCE OF  DEFINITIVE EVIDENCE OF CLONALITY THIS IS CURRENTLY BEST      DESCRIBED AS AN ATYPICAL B-CELL INFILTRATE.  MOLECULAR/FISH TESTING (I.E. B-CELL      CLONALITY AND LOW AND HIGH-GRADE FISH) ARE PENDING TO HELP FURTHER CHARACTERIZE      THE LYMPH NODE.  LYMPHOID NEXGEN SEQUENCING COULD BE CONSIDERED.     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  Accompanied by family.   Brendan Callahan 78 y.o.  male pleasant patient with a   Discussed the use of AI scribe software for clinical note transcription with the patient, who gave verbal consent to proceed.  History of Present Illness   Brendan Callahan is an 78 year old male with a new diagnosis of lymphoma who presents for a new consultation. He is accompanied by his daughter, Jenkins, who is in medical school.  He noticed a lump on his neck a few months ago, initially identified by his daughter in August. The lump, initially the size of a peppercorn, has since grown larger. No other lymphadenopathy has been noticed elsewhere on his body.  An ultrasound  revealed a lymph node measuring 1.7 cm and some thyroid  nodules.  No significant weight loss, night sweats, or changes in appetite. He experienced some sweating the previous night, attributing it to a hot blanket, but no drenching night sweats.  His past medical history includes diverticulitis, gout managed with allopurinol , coronary artery disease with a triple bypass in 2001, and a carotid stent placed two years ago. He is on Plavix , metformin for prediabetes, and has a history of hypertension and hyperlipidemia.  He has a history of smoking, having quit over 40 years ago, and drinks alcohol occasionally. 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 He reports some neuropathy but is unsure of the cause.  There is no family history of cancer.       Review of  Systems  Constitutional:  Negative for chills, diaphoresis, fever, malaise/fatigue and weight loss.  HENT:  Negative for nosebleeds and sore throat.   Eyes:  Negative for double vision.  Respiratory:  Negative for cough, hemoptysis, sputum production, shortness of breath and wheezing.   Cardiovascular:  Negative for chest pain, palpitations, orthopnea and leg swelling.  Gastrointestinal:  Negative for abdominal pain, blood in stool, constipation, diarrhea, heartburn, melena, nausea and vomiting.  Genitourinary:  Negative for dysuria, frequency and urgency.  Musculoskeletal:  Negative for back pain and joint pain.  Skin: Negative.  Negative for itching and rash.  Neurological:  Negative for dizziness, tingling, focal weakness, weakness and headaches.  Endo/Heme/Allergies:  Does not bruise/bleed easily.  Psychiatric/Behavioral:  Negative for depression. The patient is not nervous/anxious and does not have insomnia.     MEDICAL HISTORY:  Past Medical History:  Diagnosis Date   Acute diverticulitis 12/02/2017   GERD (gastroesophageal reflux disease)    Gout    on daily RX (12/02/2017)   High cholesterol    History of kidney stones    Hypertension    Type II diabetes mellitus (HCC)    Vertigo    none in over 1 year   Wears dentures    full upper and lower    SURGICAL HISTORY: Past Surgical History:  Procedure Laterality Date   BLADDER STONE REMOVAL  ~ 2004   CAROTID PTA/STENT INTERVENTION Left 10/17/2021   Procedure: CAROTID PTA/STENT INTERVENTION;  Surgeon: Marea Selinda RAMAN, MD;  Location: [facility];  Service: Cardiovascular;  Laterality: Left;   CATARACT EXTRACTION W/PHACO Right 03/18/2022   Procedure: CATARACT EXTRACTION PHACO AND INTRAOCULAR LENS PLACEMENT (IOC) RIGHT;  Surgeon: Jaye Fallow, MD;  Location: [facility];  Service: Ophthalmology;  Laterality: Right;  8.18 0:52.5   CATARACT EXTRACTION W/PHACO Left 03/04/2022   Procedure: CATARACT EXTRACTION  PHACO AND INTRAOCULAR LENS PLACEMENT (IOC) LEFT 9.82 01:02.2;  Surgeon: Jaye Fallow, MD;  Location: [facility];  Service: Ophthalmology;  Laterality: Left;  Diabetic   CORONARY ARTERY BYPASS GRAFT  2001   CABG X3   TONSILLECTOMY      SOCIAL HISTORY: Social History   Socioeconomic History   Marital status: Divorced    Spouse name: Not on file   Number of children: Not on file   Years of education: Not on file   Highest education level: Not on file  Occupational History   Not on file  Tobacco Use   Smoking status: Former    Current packs/day: 0.00    Average packs/day: 1 pack/day for 20.0 years (20.0 ttl pk-yrs)    Types: Cigarettes    Start date: 06/09/1958    Quit date: 06/09/1978  Years since quitting: 45.9   Smokeless tobacco: Never   Tobacco comments:    12/02/2017 nothing since 1980s  Vaping Use   Vaping status: Never Used  Substance and Sexual Activity   Alcohol use: Yes    Comment: 12/02/2017 couple beers/month   Drug use: Never   Sexual activity: Not Currently  Other Topics Concern   Not on file  Social History Narrative   Not on file   Social Drivers of Health   Financial Resource Strain: Not on file  Food Insecurity: No Food Insecurity (05/16/2024)   Hunger Vital Sign    Worried About Running Out of Food in the Last Year: Never true    Ran Out of Food in the Last Year: Never true  Transportation Needs: No Transportation Needs (05/16/2024)   PRAPARE - Administrator, Civil Service (Medical): No    Lack of Transportation (Non-Medical): No  Physical Activity: Not on file  Stress: Not on file  Social Connections: Not on file  Intimate Partner Violence: Not At Risk (05/16/2024)   Humiliation, Afraid, Rape, and Kick questionnaire    Fear of Current or Ex-Partner: No    Emotionally Abused: No    Physically Abused: No    Sexually Abused: No    FAMILY HISTORY: Family History  Problem Relation Age of Onset   Hernia Mother     Healthy Father     ALLERGIES:  has no known allergies.  MEDICATIONS:  Current Outpatient Medications  Medication Sig Dispense Refill   allopurinol  (ZYLOPRIM ) 300 MG tablet Take 300 mg by mouth daily.     atorvastatin  (LIPITOR) 20 MG tablet Take 40 mg by mouth daily.     clopidogrel  (PLAVIX ) 75 MG tablet Take 75 mg by mouth daily.     gabapentin  (NEURONTIN ) 300 MG capsule Take 300 mg by mouth daily.     hydrocortisone 2.5 % lotion Apply topically daily.     metFORMIN (GLUCOPHAGE) 500 MG tablet Take 500 mg by mouth 2 (two) times daily.     metoprolol  succinate (TOPROL -XL) 50 MG 24 hr tablet Take 50 mg by mouth daily.     pantoprazole  (PROTONIX ) 20 MG tablet Take 20 mg by mouth daily.     TRUE METRIX BLOOD GLUCOSE TEST test strip SMARTSIG:Via Meter     TRUEplus Lancets 33G MISC      Boswellia-Glucosamine-Vit D (OSTEO BI-FLEX ONE PER DAY PO) Take by mouth daily. (Patient not taking: Reported on 04/01/2024)     No current facility-administered medications for this visit.    PHYSICAL EXAMINATION:  Approximate 2 cm lymph node noted under the chin.  Mobile.  Nontender.  Vitals:   05/16/24 1053  BP: 97/73  Pulse: 66  Resp: 18  Temp: (!) 97.4 F (36.3 C)  SpO2: 100%   Filed Weights   05/16/24 1053  Weight: 223 lb 1.6 oz (101.2 kg)    Physical Exam Vitals and nursing note reviewed.  HENT:     Head: Normocephalic and atraumatic.     Mouth/Throat:     Pharynx: Oropharynx is clear.  Eyes:     Extraocular Movements: Extraocular movements intact.     Pupils: Pupils are equal, round, and reactive to light.  Cardiovascular:     Rate and Rhythm: Normal rate and regular rhythm.  Pulmonary:     Comments: Decreased breath sounds bilaterally.  Abdominal:     Palpations: Abdomen is soft.  Musculoskeletal:        General: Normal range  of motion.     Cervical back: Normal range of motion.  Skin:    General: Skin is warm.  Neurological:     General: No focal deficit present.      Mental Status: He is alert and oriented to person, place, and time.  Psychiatric:        Behavior: Behavior normal.        Judgment: Judgment normal.     LABORATORY DATA:  I have reviewed the data as listed Lab Results  Component Value Date   WBC 6.6 09/17/2023   HGB 14.6 09/17/2023   HCT 43.8 09/17/2023   MCV 88.8 09/17/2023   PLT 254 09/17/2023   Recent Labs    09/17/23 1258  NA 138  K 4.0  CL 106  CO2 22  GLUCOSE 111*  BUN 17  CREATININE 1.24  CALCIUM  8.9  GFRNONAA 59*  PROT 7.3  ALBUMIN 3.7  AST 21  ALT 15  ALKPHOS 97  BILITOT 0.8    RADIOGRAPHIC STUDIES: I have personally reviewed the radiological images as listed and agreed with the findings in the report. US  CORE BIOPSY (SOFT TISSUE) Result Date: 04/25/2024 INDICATION: Palpable nontender enlarging submental nodule EXAM: ULTRASOUND CORE BIOPSY OF THE SMALL PALPABLE SUBMENTAL NODULE MEDICATIONS: 1% LIDOCAINE  LOCAL ANESTHESIA/SEDATION: None. COMPLICATIONS: None immediate. PROCEDURE: Informed written consent was obtained from the patient after a thorough discussion of the procedural risks, benefits and alternatives. All questions were addressed. Maximal Sterile Barrier Technique was utilized including caps, mask, sterile gowns, sterile gloves, sterile drape, hand hygiene and skin antiseptic. A timeout was performed prior to the initiation of the procedure. Previous imaging reviewed. Preliminary ultrasound performed. The palpable submental nodule was localized and marked with ultrasound for biopsy. Under sterile conditions and local anesthesia, the 18 gauge core biopsy needle was advanced to the nodule. 18 gauge core biopsies obtained (1 cm length). Samples were intact and non fragmented. These were placed in formalin. Postprocedure imaging demonstrates no hemorrhage or hematoma. Patient tolerated the biopsy well. IMPRESSION: Successful ultrasound core biopsy of the palpable submental nodule. Electronically Signed   By: CHRISTELLA.   Shick M.D.   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 Inconclusive results due to limited tissue. No systemic symptoms. Differential diagnosis includes various non-Hodgkin's lymphoma types. - Ordered excisional lymph node biopsy for definitive diagnosis. - Ordered PET scan for metastasis assessment and staging. - Ordered repeat blood work for kidney and liver function, and hepatitis panel. - Coordinated with Dr. Blair for biopsy scheduling. - Discussed potential treatment options: radiation, chemotherapy, or surveillance based on diagnosis and staging.      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   # Hx of Gout- check uric acid.  Thank you Dr. Auston MD for allowing me to participate in the care of your pleasant patient. Please do not hesitate to contact me with questions or concerns in the interim.  I have sent a message to Dr. Blair regarding biopsy planning.  # DISPOSITION: # labs today- ordered # PET ASAP # follow up in 2-3 weeks or so- MD; no labs- Dr.B  Above plan of care was discussed with patient/family in detail.  My contact information was given to the patient/family.     Cindy JONELLE Joe, MD 05/16/2024  11:47 AM

## 2024-05-16 NOTE — Assessment & Plan Note (Addendum)
  B-cell lymphoma of cervical lymph nodes Suspicion of non-Hodgkin's lymphoma based on needle biopsy. Inconclusive results due to limited tissue. No systemic symptoms. Differential diagnosis includes various non-Hodgkin's lymphoma types. - Ordered excisional lymph node biopsy for definitive diagnosis. - Ordered PET scan for metastasis assessment and staging. - Ordered repeat blood work for kidney and liver function, and hepatitis panel. - Coordinated with Dr. Blair for biopsy scheduling. - Discussed potential treatment options: radiation, chemotherapy, or surveillance based on diagnosis and staging.      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   # Hx of Gout- check uric acid.  Thank you Dr. Auston MD for allowing me to participate in the care of your pleasant patient. Please do not hesitate to contact me with questions or concerns in the interim.  I have sent a message to Dr. Blair regarding biopsy planning.  # DISPOSITION: # labs today- ordered # PET ASAP # follow up in 2-3 weeks or so- MD; no labs- Dr.B

## 2024-05-18 ENCOUNTER — Ambulatory Visit: Admission: RE | Admit: 2024-05-18 | Discharge: 2024-05-18 | Attending: Internal Medicine

## 2024-05-18 DIAGNOSIS — R9341 Abnormal radiologic findings on diagnostic imaging of renal pelvis, ureter, or bladder: Secondary | ICD-10-CM | POA: Insufficient documentation

## 2024-05-18 DIAGNOSIS — R935 Abnormal findings on diagnostic imaging of other abdominal regions, including retroperitoneum: Secondary | ICD-10-CM | POA: Insufficient documentation

## 2024-05-18 DIAGNOSIS — C8511 Unspecified B-cell lymphoma, lymph nodes of head, face, and neck: Secondary | ICD-10-CM | POA: Insufficient documentation

## 2024-05-18 LAB — GLUCOSE, CAPILLARY: Glucose-Capillary: 116 mg/dL — ABNORMAL HIGH (ref 70–99)

## 2024-05-18 MED ORDER — FLUDEOXYGLUCOSE F - 18 (FDG) INJECTION
11.6000 | Freq: Once | INTRAVENOUS | Status: AC | PRN
  Administered 2024-05-18: 11.19 via INTRAVENOUS

## 2024-05-20 NOTE — Progress Notes (Signed)
 I tried to reach patient's daughter Jenkins, unable to reach staff to voicemail regarding the results of the PET scan plan for biopsy with Dr. Blair . follow-up as planned

## 2024-05-30 ENCOUNTER — Encounter: Payer: Self-pay | Admitting: Internal Medicine

## 2024-05-30 ENCOUNTER — Inpatient Hospital Stay (HOSPITAL_BASED_OUTPATIENT_CLINIC_OR_DEPARTMENT_OTHER): Admitting: Internal Medicine

## 2024-05-30 VITALS — BP 114/76 | HR 65 | Temp 98.0°F | Resp 18 | Ht 70.0 in | Wt 223.5 lb

## 2024-05-30 DIAGNOSIS — C8511 Unspecified B-cell lymphoma, lymph nodes of head, face, and neck: Secondary | ICD-10-CM

## 2024-05-30 NOTE — Assessment & Plan Note (Addendum)
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 Inconclusive results due to limited tissue. No systemic symptoms. DEC 10th, 2025-  Two subcutaneous fascial nodules with intense metabolic activity consistent with lymphoma, one in the left cheek and one midline inferior to the mandible.  Two small hypermetabolic lymph nodes in the left retroperitoneum along the aorta and left ureter consistent with lymphoma below the diaphragm Ordered excisional lymph node biopsy for definitive diagnosis.  This was discussed with Dr. Blair.  Reminded the patient/daughter to call Dr. Edythe office for a confirmation of biopsy appointment.  Hepatitis panel negative.  CBC unremarkable.  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  Patient will likely need systemic therapy-Rituxan based.  Specific chemotherapy regimen would be based upon the type of B-cell lymphoma based on excisional biopsy.  Radiation is unlikely given the disease above and below the diaphragm.  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    # Hx of Gout-  # Vaccinations: Age-appropriate vaccinations discussed with the patient and family.   # DISPOSITION: # follow up TBD-Dr.B- family will call us  once the appointment for the biopsy is scheduled with Dr. Alla s office for a rescheduled appointment.  # I reviewed the blood work- with the patient in detail; also reviewed the imaging independently [as summarized above]; and with the patient in detail.   "

## 2024-05-30 NOTE — Progress Notes (Signed)
 PET 05/18/24.

## 2024-05-30 NOTE — Progress Notes (Signed)
 [facility] CONSULT NOTE  Patient Care Team: Auston Reyes BIRCH, MD as PCP - General (Internal Medicine) Rennie Cindy SAUNDERS, MD as Consulting Physician (Oncology)  CHIEF COMPLAINTS/PURPOSE OF CONSULTATION: lymphoma neck  Oncology History Overview Note      1. Lymph node US - 1.7 cm [[facility]] ; Dr.Bennett- [facility]-needle/core biopsy, mid submental abnormal adenopathy :      -  ATYPICAL B-CELL INFILTRATE, SEE NOTE.      NOTE: THE BIOPSY CONSISTS OF THIN NEEDLE CORES OF LYMPHOID TISSUE WITHOUT      APPARENT NORMAL ARCHITECTURE.  THE LYMPHOCYTES APPEAR TO PERCOLATE OUTSIDE THE      CAPSULE AND WITHIN ADIPOSE TISSUE.  THE MAJORITY OF THE CELLS ARE CD20 POSITIVE      B CELLS THAT COEXPRESS BCL6 AND CD10 WHICH IS A GERMINAL CENTER/FOLLICULAR      PHENOTYPE.  THESE CELLS RANGE IN SIZE FROM SMALL CENTROCYTE LIKE CELLS TO LARGE      CENTROBLASTS LIKE CELLS.  THESE LARGER CELLS APPEAR TO BE THE MAJORITY WITH      FOCAL COALESCING/SHEETLIKE APPEARANCE.  BCL2 IS WEAKLY POSITIVE BUT NOT      DEFINITIVELY NEGATIVE AS IN A REACTIVE GERMINAL CENTER BUT NOT OVEREXPRESSED      TYPICAL OF LYMPHOMA.  THE CELLS DO NOT SHOW GOOD POLARIZATION AND THE GERMINAL      CENTERS/FOLLICLES ALSO COALESCE AND ARE EXTREMELY LARGE/GEOGRAPHIC.  THE      TYPICAL HIGH PROLIFERATION RATE OF A REACTIVE GERMINAL CENTER IS NOT PRESENT      WITH A KI-67 SHOWING A PROLIFERATION RATE OF ABOUT 15 TO 20%.  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      CD3, CD5 AND CD43 HIGHLIGHT BACKGROUND SMALL T CELLS.  CYCLIN D1 IS      APPROPRIATELY NEGATIVE.  MUM1 SHOWS SCATTERED POSITIVITY.  CD30 IS NEGATIVE.      THE OVERALL FINDINGS ARE ATYPICAL AND SUSPICIOUS FOR A LYMPHOPROLIFERATIVE      DISORDER WITH A FOLLICLE/GERMINAL CENTER IMMUNOPHENOTYPE SUCH AS FOLLICULAR      LYMPHOMA OR POSSIBLY DIFFUSE LARGE B-CELL LYMPHOMA; HOWEVER, GIVEN THE LIMITED      MATERIAL AND ABSENCE OF  DEFINITIVE EVIDENCE OF CLONALITY THIS IS CURRENTLY BEST      DESCRIBED AS AN ATYPICAL B-CELL INFILTRATE.  MOLECULAR/FISH TESTING (I.E. B-CELL      CLONALITY AND LOW AND HIGH-GRADE FISH) ARE PENDING TO HELP FURTHER CHARACTERIZE      THE LYMPH NODE.  LYMPHOID NEXGEN SEQUENCING COULD BE CONSIDERED.     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  Accompanied by daughter .   Brendan Callahan 78 y.o.  male pleasant patient with a   Discussed the use of AI scribe software for clinical note transcription with the patient, who gave verbal consent to proceed.  History of Present Illness   Brendan Callahan is an 78 year old male with newly diagnosed, unclassified B-cell non-Hodgkin lymphoma who presents to review PET scan results and discuss ongoing diagnostic evaluation.  He is currently awaiting excisional biopsy for definitive lymphoma classification, with ongoing coordination for scheduling. He previously underwent a needle biopsy and anticipates the excisional procedure will occur after the holidays.  He has not experienced significant symptoms related to lymphadenopathy. He describes a small nodule on the left  cheek that has decreased in size since the last visit and is now nearly resolved. He denies use of corticosteroids and reports no fatigue, weight loss, fevers, chills, or night sweats.  Recent PET scan showed a small area on the left cheek and small lymph nodes deep in the abdomen. The patient recalls being told there was no involvement of the bone, liver, or spleen. Laboratory studies revealed normal CBC, non-elevated LDH, slightly elevated ALK, and a GFR of 47, consistent with baseline. Hepatitis serologies were negative. He is up to date on vaccinations, including shingles, pneumonia, and influenza.      Review of Systems  Constitutional:   Negative for chills, diaphoresis, fever, malaise/fatigue and weight loss.  HENT:  Negative for nosebleeds and sore throat.   Eyes:  Negative for double vision.  Respiratory:  Negative for cough, hemoptysis, sputum production, shortness of breath and wheezing.   Cardiovascular:  Negative for chest pain, palpitations, orthopnea and leg swelling.  Gastrointestinal:  Negative for abdominal pain, blood in stool, constipation, diarrhea, heartburn, melena, nausea and vomiting.  Genitourinary:  Negative for dysuria, frequency and urgency.  Musculoskeletal:  Negative for back pain and joint pain.  Skin: Negative.  Negative for itching and rash.  Neurological:  Negative for dizziness, tingling, focal weakness, weakness and headaches.  Endo/Heme/Allergies:  Does not bruise/bleed easily.  Psychiatric/Behavioral:  Negative for depression. The patient is not nervous/anxious and does not have insomnia.     MEDICAL HISTORY:  Past Medical History:  Diagnosis Date   Acute diverticulitis 12/02/2017   GERD (gastroesophageal reflux disease)    Gout    on daily RX (12/02/2017)   High cholesterol    History of kidney stones    Hypertension    Type II diabetes mellitus (HCC)    Vertigo    none in over 1 year   Wears dentures    full upper and lower    SURGICAL HISTORY: Past Surgical History:  Procedure Laterality Date   BLADDER STONE REMOVAL  ~ 2004   CAROTID PTA/STENT INTERVENTION Left 10/17/2021   Procedure: CAROTID PTA/STENT INTERVENTION;  Surgeon: Marea Selinda RAMAN, MD;  Location: [facility];  Service: Cardiovascular;  Laterality: Left;   CATARACT EXTRACTION W/PHACO Right 03/18/2022   Procedure: CATARACT EXTRACTION PHACO AND INTRAOCULAR LENS PLACEMENT (IOC) RIGHT;  Surgeon: Jaye Fallow, MD;  Location: [facility];  Service: Ophthalmology;  Laterality: Right;  8.18 0:52.5   CATARACT EXTRACTION W/PHACO Left 03/04/2022   Procedure: CATARACT EXTRACTION PHACO AND INTRAOCULAR LENS  PLACEMENT (IOC) LEFT 9.82 01:02.2;  Surgeon: Jaye Fallow, MD;  Location: [facility];  Service: Ophthalmology;  Laterality: Left;  Diabetic   CORONARY ARTERY BYPASS GRAFT  2001   CABG X3   TONSILLECTOMY      SOCIAL HISTORY: Social History   Socioeconomic History   Marital status: Divorced    Spouse name: Not on file   Number of children: Not on file   Years of education: Not on file   Highest education level: Not on file  Occupational History   Not on file  Tobacco Use   Smoking status: Former    Current packs/day: 0.00    Average packs/day: 1 pack/day for 20.0 years (20.0 ttl pk-yrs)    Types: Cigarettes    Start date: 06/09/1958    Quit date: 06/09/1978    Years since quitting: 46.0   Smokeless tobacco: Never   Tobacco comments:    12/02/2017 nothing since 1980s  Vaping Use  Vaping status: Never Used  Substance and Sexual Activity   Alcohol use: Yes    Comment: 12/02/2017 couple beers/month   Drug use: Never   Sexual activity: Not Currently  Other Topics Concern   Not on file  Social History Narrative   Not on file   Social Drivers of Health   Tobacco Use: Medium Risk (05/30/2024)   Patient History    Smoking Tobacco Use: Former    Smokeless Tobacco Use: Never    Passive Exposure: Not on Actuary Strain: Not on file  Food Insecurity: No Food Insecurity (05/16/2024)   Epic    Worried About Programme Researcher, Broadcasting/film/video in the Last Year: Never true    Ran Out of Food in the Last Year: Never true  Transportation Needs: No Transportation Needs (05/16/2024)   Epic    Lack of Transportation (Medical): No    Lack of Transportation (Non-Medical): No  Physical Activity: Not on file  Stress: Not on file  Social Connections: Not on file  Intimate Partner Violence: Not At Risk (05/16/2024)   Epic    Fear of Current or Ex-Partner: No    Emotionally Abused: No    Physically Abused: No    Sexually Abused: No  Depression (PHQ2-9): Low Risk  (05/30/2024)   Depression (PHQ2-9)    PHQ-2 Score: 0  Alcohol Screen: Not on file  Housing: Low Risk (05/16/2024)   Epic    Unable to Pay for Housing in the Last Year: No    Number of Times Moved in the Last Year: 0    Homeless in the Last Year: No  Utilities: Not At Risk (05/16/2024)   Epic    Threatened with loss of utilities: No  Health Literacy: Not on file    FAMILY HISTORY: Family History  Problem Relation Age of Onset   Hernia Mother    Healthy Father     ALLERGIES:  has no known allergies.  MEDICATIONS:  Current Outpatient Medications  Medication Sig Dispense Refill   allopurinol  (ZYLOPRIM ) 300 MG tablet Take 300 mg by mouth daily.     atorvastatin  (LIPITOR) 20 MG tablet Take 40 mg by mouth daily.     clopidogrel  (PLAVIX ) 75 MG tablet Take 75 mg by mouth daily.     gabapentin  (NEURONTIN ) 300 MG capsule Take 300 mg by mouth daily.     hydrocortisone 2.5 % lotion Apply topically daily.     metFORMIN (GLUCOPHAGE) 500 MG tablet Take 500 mg by mouth 2 (two) times daily.     metoprolol  succinate (TOPROL -XL) 50 MG 24 hr tablet Take 50 mg by mouth daily.     pantoprazole  (PROTONIX ) 20 MG tablet Take 20 mg by mouth daily.     TRUE METRIX BLOOD GLUCOSE TEST test strip SMARTSIG:Via Meter     TRUEplus Lancets 33G MISC      No current facility-administered medications for this visit.    PHYSICAL EXAMINATION:  Approximate 2 cm lymph node noted under the chin.  Mobile.  Nontender.  Vitals:   05/30/24 1417  BP: 114/76  Pulse: 65  Resp: 18  Temp: 98 F (36.7 C)  SpO2: 98%    Filed Weights   05/30/24 1417  Weight: 223 lb 8 oz (101.4 kg)   Approximately 1 to 2 cm subcutaneous nodule noted under the chin.  The left maxillary nodule vaguely felt not clearly demarcated.  Physical Exam Vitals and nursing note reviewed.  HENT:     Head: Normocephalic and  atraumatic.     Mouth/Throat:     Pharynx: Oropharynx is clear.  Eyes:     Extraocular Movements: Extraocular  movements intact.     Pupils: Pupils are equal, round, and reactive to light.  Cardiovascular:     Rate and Rhythm: Normal rate and regular rhythm.  Pulmonary:     Comments: Decreased breath sounds bilaterally.  Abdominal:     Palpations: Abdomen is soft.  Musculoskeletal:        General: Normal range of motion.     Cervical back: Normal range of motion.  Skin:    General: Skin is warm.  Neurological:     General: No focal deficit present.     Mental Status: He is alert and oriented to person, place, and time.  Psychiatric:        Behavior: Behavior normal.        Judgment: Judgment normal.     LABORATORY DATA:  I have reviewed the data as listed Lab Results  Component Value Date   WBC 10.5 05/16/2024   HGB 15.3 05/16/2024   HCT 45.6 05/16/2024   MCV 86.2 05/16/2024   PLT 198 05/16/2024   Recent Labs    09/17/23 1258 05/16/24 1141  NA 138 138  K 4.0 4.1  CL 106 103  CO2 22 23  GLUCOSE 111* 88  BUN 17 20  CREATININE 1.24 1.49*  CALCIUM  8.9 8.8*  GFRNONAA 59* 47*  PROT 7.3 7.2  ALBUMIN 3.7 4.1  AST 21 20  ALT 15 11  ALKPHOS 97 149*  BILITOT 0.8 0.5    RADIOGRAPHIC STUDIES: I have personally reviewed the radiological images as listed and agreed with the findings in the report. NM PET Image Initial (PI) Skull Base To Thigh (F-18 FDG) Result Date: 05/18/2024 EXAM: PET AND CT SKULL BASE TO MID THIGH 05/18/2024 01:12:46 PM TECHNIQUE: RADIOPHARMACEUTICAL: 11.19 mCi F-18 FDG Uptake time 60 minutes. Glucose level 116 mg/dl. PET imaging was acquired from the base of the skull to the mid thighs. Non-contrast enhanced computed tomography was obtained for attenuation correction and anatomic localization. COMPARISON: None available. CLINICAL HISTORY: cancer- lymphoma neck cancer- lymphoma neck FINDINGS: HEAD AND NECK: Soft tissue nodule in the subcutaneous tissue of the left cheek measures 19 mm on image 18 with intense metabolic activity (SUV max 12.4), consistent with  given history of lymphoma. A second subcutaneous nodule midline inferior to the mandible measuring 15 mm on image 189 also demonstrates intense metabolic activity, consistent with given history of lymphoma. No hypermetabolic cervical lymph nodes. CHEST: No abnormal metabolic activity in the thorax. Post midline sternotomy. Band of benign scarring in the right lower lobe. No metabolically active pulmonary nodules. No metabolically active lymphadenopathy. ABDOMEN AND PELVIS: Small retroperitoneal lymph nodes left of the abdominal aorta measuring 12 mm (image 108) with intense metabolic activity (SUV max 10.2), consistent with lymphoma below the diaphragm. A second very subtle nodule adjacent to the mid left ureter measuring 16 mm on image 115 also has intense metabolic activity for size (SUV max 5.6). Multiple diverticula of the left colon and sigmoid colon without clear acute inflammation. Prostate unremarkable. Aorta atherosclerotic calcification of the aorta. The spleen is of normal volume and exhibits normal metabolic activity. Physiologic activity within the gastrointestinal and genitourinary systems. BONES AND SOFT TISSUE: No abnormal FDG activity localizes to the bones. IMPRESSION: 1. Two subcutaneous fascial nodules with intense metabolic activity consistent with lymphoma, one in the left cheek and one midline inferior to the mandible.  2. Two small hypermetabolic lymph nodes in the left retroperitoneum along the aorta and left ureter consistent with lymphoma below the diaphragm. 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 Inconclusive results due to limited tissue. No systemic symptoms. DEC 10th, 2025-  Two subcutaneous fascial nodules with intense metabolic activity consistent with lymphoma, one in the left  cheek and one midline inferior to the mandible.  Two small hypermetabolic lymph nodes in the left retroperitoneum along the aorta and left ureter consistent with lymphoma below the diaphragm Ordered excisional lymph node biopsy for definitive diagnosis.  This was discussed with Dr. Blair.  Reminded the patient/daughter to call Dr. Edythe office for a confirmation of biopsy appointment.  Hepatitis panel negative.  CBC unremarkable.  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  Patient will likely need systemic therapy-Rituxan based.  Specific chemotherapy regimen would be based upon the type of B-cell lymphoma based on excisional biopsy.  Radiation is unlikely given the disease above and below the diaphragm.  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    # Hx of Gout-  # Vaccinations: Age-appropriate vaccinations discussed with the patient and family.   # DISPOSITION: # follow up TBD-Dr.B- family will call us  once the appointment for the biopsy is scheduled with Dr. Alla s office for a rescheduled appointment.  # I reviewed the blood work- with the patient in detail; also reviewed the imaging independently [as summarized above]; and with the patient in detail.    Above plan of care was discussed with patient/family in detail.  My contact information was given to the patient/family.     Cindy JONELLE Joe, MD 06/04/2024 5:39 PM

## 2024-06-15 ENCOUNTER — Inpatient Hospital Stay: Attending: Internal Medicine

## 2024-06-15 DIAGNOSIS — C8511 Unspecified B-cell lymphoma, lymph nodes of head, face, and neck: Secondary | ICD-10-CM

## 2024-06-15 NOTE — Progress Notes (Signed)
 Multidisciplinary Oncology Council Documentation  Brendan Callahan was presented by our [facility] on 06/15/2024, which included representatives from:  Palliative Care Dietitian  Physical/Occupational Therapist Nurse Navigator Genetics Social work Survivorship RN Financial Navigator Research RN   Brendan Callahan currently presents with history of lymphoma  We reviewed previous medical and familial history, history of present illness, and recent lab results along with all available histopathologic and imaging studies. The MOC considered available treatment options and made the following recommendations/referrals:  Nutrition  The MOC is a meeting of clinicians from various specialty areas who evaluate and discuss patients for whom a multidisciplinary approach is being considered. Final determinations in the plan of care are those of the provider(s).   Today's extended care, comprehensive team conference, Brendan Callahan was not present for the discussion and was not examined.

## 2024-06-23 ENCOUNTER — Other Ambulatory Visit: Payer: Self-pay | Admitting: Otolaryngology

## 2024-07-05 ENCOUNTER — Ambulatory Visit: Admission: RE | Admit: 2024-07-05 | Source: Home / Self Care | Admitting: Otolaryngology

## 2024-07-05 ENCOUNTER — Encounter: Admission: RE | Payer: Self-pay | Source: Home / Self Care

## 2024-07-05 ENCOUNTER — Telehealth: Payer: Self-pay | Admitting: Internal Medicine

## 2024-07-05 SURGERY — EXCISION, LYMPH NODE, CERVICAL
Anesthesia: General

## 2024-07-05 NOTE — Telephone Encounter (Signed)
 I spoke to Dr. Dolph the concern of lymphoma/based on BCR gene rearrangement-consistent with non-Hodgkin's lymphoma.  Recommend excisional biopsy for further delineation.  Dr. Edythe office is going to reach out to the family to schedule appointment.  I spoke to patient's daughter,Anne-explained the rationale she understands. She is in agreement  She will follow-up with her father regarding proceeding with biopsy.    Please have the patient follow-up with me-in about 2- 3 weeks- MD; no labs GB

## 2024-07-06 ENCOUNTER — Other Ambulatory Visit: Payer: Self-pay | Admitting: Otolaryngology

## 2024-07-25 ENCOUNTER — Inpatient Hospital Stay: Admitting: Internal Medicine

## 2024-08-02 ENCOUNTER — Ambulatory Visit: Admit: 2024-08-02 | Admitting: Otolaryngology

## 2025-03-31 ENCOUNTER — Encounter (INDEPENDENT_AMBULATORY_CARE_PROVIDER_SITE_OTHER)

## 2025-03-31 ENCOUNTER — Ambulatory Visit (INDEPENDENT_AMBULATORY_CARE_PROVIDER_SITE_OTHER): Admitting: Nurse Practitioner
